# Patient Record
Sex: Male | Born: 1960 | Race: White | Hispanic: No | Marital: Single | Smoking: Current every day smoker
Health system: Southern US, Community
[De-identification: ages and names within clinical notes are randomized; demographics above are authoritative.]

## PROBLEM LIST (undated history)

## (undated) DIAGNOSIS — E785 Hyperlipidemia, unspecified: Secondary | ICD-10-CM

## (undated) DIAGNOSIS — I10 Essential (primary) hypertension: Secondary | ICD-10-CM

## (undated) DIAGNOSIS — N189 Chronic kidney disease, unspecified: Secondary | ICD-10-CM

## (undated) DIAGNOSIS — F32A Depression, unspecified: Secondary | ICD-10-CM

## (undated) DIAGNOSIS — F329 Major depressive disorder, single episode, unspecified: Secondary | ICD-10-CM

## (undated) DIAGNOSIS — I639 Cerebral infarction, unspecified: Secondary | ICD-10-CM

## (undated) HISTORY — PX: HERNIA REPAIR: SHX51

## (undated) HISTORY — DX: Essential (primary) hypertension: I10

## (undated) HISTORY — PX: REPAIR KNEE LIGAMENT: SUR1188

## (undated) HISTORY — PX: WRIST SURGERY: SHX841

## (undated) HISTORY — DX: Major depressive disorder, single episode, unspecified: F32.9

## (undated) HISTORY — DX: Hyperlipidemia, unspecified: E78.5

## (undated) HISTORY — DX: Chronic kidney disease, unspecified: N18.9

## (undated) HISTORY — DX: Cerebral infarction, unspecified: I63.9

## (undated) HISTORY — DX: Depression, unspecified: F32.A

---

## 2005-08-07 ENCOUNTER — Ambulatory Visit: Payer: Self-pay | Admitting: General Surgery

## 2006-07-09 ENCOUNTER — Inpatient Hospital Stay: Payer: Self-pay | Admitting: Unknown Physician Specialty

## 2006-07-09 ENCOUNTER — Other Ambulatory Visit: Payer: Self-pay

## 2009-02-04 ENCOUNTER — Ambulatory Visit: Payer: Self-pay | Admitting: General Surgery

## 2009-02-09 ENCOUNTER — Ambulatory Visit: Payer: Self-pay | Admitting: General Surgery

## 2009-08-06 ENCOUNTER — Inpatient Hospital Stay (HOSPITAL_COMMUNITY)
Admission: RE | Admit: 2009-08-06 | Discharge: 2009-08-20 | Payer: Self-pay | Admitting: Physical Medicine & Rehabilitation

## 2009-08-06 ENCOUNTER — Ambulatory Visit: Payer: Self-pay | Admitting: Physical Medicine & Rehabilitation

## 2009-08-07 ENCOUNTER — Ambulatory Visit: Payer: Self-pay | Admitting: Physical Medicine & Rehabilitation

## 2009-08-24 ENCOUNTER — Encounter
Admission: RE | Admit: 2009-08-24 | Discharge: 2009-11-22 | Payer: Self-pay | Admitting: Physical Medicine & Rehabilitation

## 2009-09-27 ENCOUNTER — Encounter
Admission: RE | Admit: 2009-09-27 | Discharge: 2009-11-24 | Payer: Self-pay | Admitting: Physical Medicine & Rehabilitation

## 2009-09-28 ENCOUNTER — Ambulatory Visit: Payer: Self-pay | Admitting: Physical Medicine & Rehabilitation

## 2009-11-09 ENCOUNTER — Ambulatory Visit: Payer: Self-pay | Admitting: Physical Medicine & Rehabilitation

## 2009-11-24 ENCOUNTER — Encounter
Admission: RE | Admit: 2009-11-24 | Discharge: 2009-11-25 | Payer: Self-pay | Admitting: Physical Medicine & Rehabilitation

## 2009-11-25 ENCOUNTER — Encounter
Admission: RE | Admit: 2009-11-25 | Discharge: 2009-12-01 | Payer: Self-pay | Admitting: Physical Medicine & Rehabilitation

## 2009-12-17 ENCOUNTER — Encounter
Admission: RE | Admit: 2009-12-17 | Discharge: 2010-03-17 | Payer: Self-pay | Admitting: Physical Medicine & Rehabilitation

## 2009-12-30 ENCOUNTER — Ambulatory Visit: Payer: Self-pay | Admitting: Physical Medicine & Rehabilitation

## 2010-02-18 ENCOUNTER — Ambulatory Visit: Payer: Self-pay | Admitting: Physical Medicine & Rehabilitation

## 2011-03-03 LAB — DIFFERENTIAL
Eosinophils Absolute: 0.1 10*3/uL (ref 0.0–0.7)
Eosinophils Relative: 1 % (ref 0–5)
Lymphs Abs: 1.1 10*3/uL (ref 0.7–4.0)
Monocytes Absolute: 0.6 10*3/uL (ref 0.1–1.0)
Monocytes Relative: 8 % (ref 3–12)

## 2011-03-03 LAB — COMPREHENSIVE METABOLIC PANEL
ALT: 72 U/L — ABNORMAL HIGH (ref 0–53)
AST: 47 U/L — ABNORMAL HIGH (ref 0–37)
Albumin: 4.4 g/dL (ref 3.5–5.2)
Alkaline Phosphatase: 63 U/L (ref 39–117)
Calcium: 10.2 mg/dL (ref 8.4–10.5)
GFR calc Af Amer: >60 mL/min (ref >60)
Potassium: 3.5 mEq/L (ref 3.5–5.1)
Sodium: 140 mEq/L (ref 135–145)
Total Protein: 7.7 g/dL (ref 6.0–8.3)

## 2011-03-03 LAB — CBC
MCHC: 34.2 g/dL (ref 30.0–36.0)
RDW: 13.7 % (ref 11.5–15.5)

## 2011-05-09 ENCOUNTER — Emergency Department: Payer: Self-pay | Admitting: Unknown Physician Specialty

## 2011-05-18 ENCOUNTER — Emergency Department: Payer: Self-pay | Admitting: Unknown Physician Specialty

## 2013-12-23 DIAGNOSIS — N189 Chronic kidney disease, unspecified: Secondary | ICD-10-CM | POA: Diagnosis not present

## 2013-12-23 DIAGNOSIS — Z1331 Encounter for screening for depression: Secondary | ICD-10-CM | POA: Diagnosis not present

## 2013-12-23 DIAGNOSIS — Z0000 Encounter for general adult medical examination without abnormal findings: Secondary | ICD-10-CM | POA: Diagnosis not present

## 2013-12-23 DIAGNOSIS — E785 Hyperlipidemia, unspecified: Secondary | ICD-10-CM | POA: Diagnosis not present

## 2013-12-23 DIAGNOSIS — I129 Hypertensive chronic kidney disease with stage 1 through stage 4 chronic kidney disease, or unspecified chronic kidney disease: Secondary | ICD-10-CM | POA: Diagnosis not present

## 2013-12-23 LAB — PSA

## 2014-02-04 DIAGNOSIS — I10 Essential (primary) hypertension: Secondary | ICD-10-CM | POA: Diagnosis not present

## 2014-02-04 DIAGNOSIS — I129 Hypertensive chronic kidney disease with stage 1 through stage 4 chronic kidney disease, or unspecified chronic kidney disease: Secondary | ICD-10-CM | POA: Diagnosis not present

## 2014-05-27 ENCOUNTER — Ambulatory Visit: Payer: Self-pay | Admitting: Internal Medicine

## 2014-05-28 ENCOUNTER — Emergency Department: Payer: Self-pay

## 2014-05-28 DIAGNOSIS — R109 Unspecified abdominal pain: Secondary | ICD-10-CM | POA: Diagnosis not present

## 2014-05-28 LAB — COMPREHENSIVE METABOLIC PANEL
ALBUMIN: 4.5 g/dL (ref 3.4–5.0)
ALK PHOS: 69 U/L
ALT: 34 U/L (ref 12–78)
ANION GAP: 5 — AB (ref 7–16)
BUN: 25 mg/dL — ABNORMAL HIGH (ref 7–18)
Bilirubin,Total: 1.7 mg/dL — ABNORMAL HIGH (ref 0.2–1.0)
CREATININE: 1.87 mg/dL — AB (ref 0.60–1.30)
Calcium, Total: 10.4 mg/dL — ABNORMAL HIGH (ref 8.5–10.1)
Chloride: 105 mmol/L (ref 98–107)
Co2: 28 mmol/L (ref 21–32)
EGFR (African American): 46 — ABNORMAL LOW
EGFR (Non-African Amer.): 40 — ABNORMAL LOW
GLUCOSE: 113 mg/dL — AB (ref 65–99)
Osmolality: 281 (ref 275–301)
POTASSIUM: 4.6 mmol/L (ref 3.5–5.1)
SGOT(AST): 22 U/L (ref 15–37)
Sodium: 138 mmol/L (ref 136–145)
TOTAL PROTEIN: 8.4 g/dL — AB (ref 6.4–8.2)

## 2014-05-28 LAB — CBC
HCT: 48.1 % (ref 40.0–52.0)
HGB: 15.8 g/dL (ref 13.0–18.0)
MCH: 28.4 pg (ref 26.0–34.0)
MCHC: 32.8 g/dL (ref 32.0–36.0)
MCV: 87 fL (ref 80–100)
Platelet: 93 10*3/uL — ABNORMAL LOW (ref 150–440)
RBC: 5.55 10*6/uL (ref 4.40–5.90)
RDW: 16.9 % — AB (ref 11.5–14.5)
WBC: 11.7 10*3/uL — AB (ref 3.8–10.6)

## 2014-06-09 ENCOUNTER — Inpatient Hospital Stay: Payer: Self-pay | Admitting: Internal Medicine

## 2014-06-09 DIAGNOSIS — I82220 Acute embolism and thrombosis of inferior vena cava: Secondary | ICD-10-CM | POA: Diagnosis not present

## 2014-06-09 DIAGNOSIS — I82409 Acute embolism and thrombosis of unspecified deep veins of unspecified lower extremity: Secondary | ICD-10-CM | POA: Diagnosis not present

## 2014-06-09 DIAGNOSIS — R1031 Right lower quadrant pain: Secondary | ICD-10-CM | POA: Diagnosis not present

## 2014-06-09 DIAGNOSIS — D72829 Elevated white blood cell count, unspecified: Secondary | ICD-10-CM | POA: Diagnosis not present

## 2014-06-09 DIAGNOSIS — I823 Embolism and thrombosis of renal vein: Secondary | ICD-10-CM | POA: Diagnosis not present

## 2014-06-09 DIAGNOSIS — F17200 Nicotine dependence, unspecified, uncomplicated: Secondary | ICD-10-CM | POA: Diagnosis not present

## 2014-06-09 DIAGNOSIS — K8020 Calculus of gallbladder without cholecystitis without obstruction: Secondary | ICD-10-CM | POA: Diagnosis not present

## 2014-06-09 DIAGNOSIS — I10 Essential (primary) hypertension: Secondary | ICD-10-CM | POA: Diagnosis not present

## 2014-06-09 DIAGNOSIS — N390 Urinary tract infection, site not specified: Secondary | ICD-10-CM | POA: Diagnosis not present

## 2014-06-09 DIAGNOSIS — N179 Acute kidney failure, unspecified: Secondary | ICD-10-CM | POA: Diagnosis not present

## 2014-06-09 LAB — COMPREHENSIVE METABOLIC PANEL
ALBUMIN: 3.8 g/dL (ref 3.4–5.0)
ALT: 21 U/L (ref 12–78)
ANION GAP: 6 — AB (ref 7–16)
Alkaline Phosphatase: 74 U/L
BUN: 33 mg/dL — ABNORMAL HIGH (ref 7–18)
Bilirubin,Total: 1.7 mg/dL — ABNORMAL HIGH (ref 0.2–1.0)
CALCIUM: 11 mg/dL — AB (ref 8.5–10.1)
Chloride: 102 mmol/L (ref 98–107)
Co2: 27 mmol/L (ref 21–32)
Creatinine: 1.95 mg/dL — ABNORMAL HIGH (ref 0.60–1.30)
EGFR (Non-African Amer.): 38 — ABNORMAL LOW
GFR CALC AF AMER: 44 — AB
GLUCOSE: 120 mg/dL — AB (ref 65–99)
Osmolality: 279 (ref 275–301)
POTASSIUM: 4.3 mmol/L (ref 3.5–5.1)
SGOT(AST): 23 U/L (ref 15–37)
SODIUM: 135 mmol/L — AB (ref 136–145)
TOTAL PROTEIN: 8.9 g/dL — AB (ref 6.4–8.2)

## 2014-06-09 LAB — URINALYSIS, COMPLETE
BACTERIA: NONE SEEN
BLOOD: NEGATIVE
Bilirubin,UR: NEGATIVE
Glucose,UR: NEGATIVE mg/dL (ref 0–75)
Ketone: NEGATIVE
Leukocyte Esterase: NEGATIVE
Nitrite: NEGATIVE
PH: 5 (ref 4.5–8.0)
SQUAMOUS EPITHELIAL: NONE SEEN
Specific Gravity: 1.021 (ref 1.003–1.030)
WBC UR: 1 /HPF (ref 0–5)

## 2014-06-09 LAB — CBC
HCT: 44 % (ref 40.0–52.0)
HGB: 14.9 g/dL (ref 13.0–18.0)
MCH: 29.2 pg (ref 26.0–34.0)
MCHC: 33.9 g/dL (ref 32.0–36.0)
MCV: 86 fL (ref 80–100)
Platelet: 170 10*3/uL (ref 150–440)
RBC: 5.1 10*6/uL (ref 4.40–5.90)
RDW: 15.8 % — ABNORMAL HIGH (ref 11.5–14.5)
WBC: 18.3 10*3/uL — ABNORMAL HIGH (ref 3.8–10.6)

## 2014-06-09 LAB — TROPONIN I: Troponin-I: <0.02 ng/mL

## 2014-06-09 LAB — TSH: Thyroid Stimulating Horm: 2.04 u[IU]/mL

## 2014-06-09 LAB — PROTIME-INR
INR: 1.1
PROTHROMBIN TIME: 13.7 s (ref 11.5–14.7)

## 2014-06-09 LAB — APTT: ACTIVATED PTT: 39.7 s — AB (ref 23.6–35.9)

## 2014-06-09 LAB — LIPASE, BLOOD: Lipase: 205 U/L (ref 73–393)

## 2014-06-10 DIAGNOSIS — I824Y9 Acute embolism and thrombosis of unspecified deep veins of unspecified proximal lower extremity: Secondary | ICD-10-CM | POA: Diagnosis not present

## 2014-06-10 DIAGNOSIS — I10 Essential (primary) hypertension: Secondary | ICD-10-CM | POA: Diagnosis not present

## 2014-06-10 DIAGNOSIS — D72829 Elevated white blood cell count, unspecified: Secondary | ICD-10-CM | POA: Diagnosis not present

## 2014-06-10 DIAGNOSIS — J9819 Other pulmonary collapse: Secondary | ICD-10-CM | POA: Diagnosis not present

## 2014-06-10 LAB — CBC WITH DIFFERENTIAL/PLATELET
BASOS ABS: 0.1 10*3/uL (ref 0.0–0.1)
BASOS PCT: 0.6 %
BASOS PCT: 0.8 %
Basophil #: 0.1 10*3/uL (ref 0.0–0.1)
EOS ABS: 0.2 10*3/uL (ref 0.0–0.7)
EOS ABS: 0.2 10*3/uL (ref 0.0–0.7)
EOS PCT: 1.6 %
Eosinophil %: 1.2 %
HCT: 38.3 % — ABNORMAL LOW (ref 40.0–52.0)
HCT: 39.8 % — ABNORMAL LOW (ref 40.0–52.0)
HGB: 12.9 g/dL — ABNORMAL LOW (ref 13.0–18.0)
HGB: 13.4 g/dL (ref 13.0–18.0)
LYMPHS ABS: 1.7 10*3/uL (ref 1.0–3.6)
LYMPHS PCT: 12.8 %
Lymphocyte #: 1.5 10*3/uL (ref 1.0–3.6)
Lymphocyte %: 12.8 %
MCH: 28.8 pg (ref 26.0–34.0)
MCH: 29.1 pg (ref 26.0–34.0)
MCHC: 33.6 g/dL (ref 32.0–36.0)
MCHC: 33.6 g/dL (ref 32.0–36.0)
MCV: 86 fL (ref 80–100)
MCV: 87 fL (ref 80–100)
MONOS PCT: 8.2 %
Monocyte #: 1.1 x10 3/mm — ABNORMAL HIGH (ref 0.2–1.0)
Monocyte #: 1.1 x10 3/mm — ABNORMAL HIGH (ref 0.2–1.0)
Monocyte %: 8.9 %
NEUTROS ABS: 9.1 10*3/uL — AB (ref 1.4–6.5)
NEUTROS PCT: 75.9 %
Neutrophil #: 10.3 10*3/uL — ABNORMAL HIGH (ref 1.4–6.5)
Neutrophil %: 77.2 %
PLATELETS: 127 10*3/uL — AB (ref 150–440)
PLATELETS: 162 10*3/uL (ref 150–440)
RBC: 4.46 10*6/uL (ref 4.40–5.90)
RBC: 4.59 10*6/uL (ref 4.40–5.90)
RDW: 16.2 % — AB (ref 11.5–14.5)
RDW: 16.3 % — ABNORMAL HIGH (ref 11.5–14.5)
WBC: 13.4 10*3/uL — ABNORMAL HIGH (ref 3.8–10.6)
WBC: 12 10*3/uL — ABNORMAL HIGH (ref 3.8–10.6)

## 2014-06-10 LAB — BASIC METABOLIC PANEL
Anion Gap: 8 (ref 7–16)
BUN: 35 mg/dL — AB (ref 7–18)
CHLORIDE: 103 mmol/L (ref 98–107)
CREATININE: 1.8 mg/dL — AB (ref 0.60–1.30)
Calcium, Total: 9 mg/dL (ref 8.5–10.1)
Co2: 24 mmol/L (ref 21–32)
EGFR (African American): 48 — ABNORMAL LOW
EGFR (Non-African Amer.): 42 — ABNORMAL LOW
Glucose: 111 mg/dL — ABNORMAL HIGH (ref 65–99)
Osmolality: 279 (ref 275–301)
POTASSIUM: 3.9 mmol/L (ref 3.5–5.1)
SODIUM: 135 mmol/L — AB (ref 136–145)

## 2014-06-10 LAB — PROTEIN / CREATININE RATIO, URINE
CREATININE, URINE: 158.7 mg/dL — AB (ref 30.0–125.0)
PROTEIN, RANDOM URINE: 32 mg/dL — AB (ref 0–12)
PROTEIN/CREAT. RATIO: 202 mg/g{creat} — AB (ref 0–200)

## 2014-06-10 LAB — PROTIME-INR
INR: 1.1
Prothrombin Time: 13.6 secs (ref 11.5–14.7)

## 2014-06-10 LAB — HEPARIN LEVEL (UNFRACTIONATED)
Anti-Xa(Unfractionated): 0.19 IU/mL — ABNORMAL LOW (ref 0.30–0.70)
Anti-Xa(Unfractionated): 0.48 IU/mL (ref 0.30–0.70)

## 2014-06-11 DIAGNOSIS — N179 Acute kidney failure, unspecified: Secondary | ICD-10-CM | POA: Diagnosis not present

## 2014-06-11 DIAGNOSIS — I824Y9 Acute embolism and thrombosis of unspecified deep veins of unspecified proximal lower extremity: Secondary | ICD-10-CM | POA: Diagnosis not present

## 2014-06-11 DIAGNOSIS — I82890 Acute embolism and thrombosis of other specified veins: Secondary | ICD-10-CM | POA: Diagnosis not present

## 2014-06-11 DIAGNOSIS — R799 Abnormal finding of blood chemistry, unspecified: Secondary | ICD-10-CM | POA: Diagnosis not present

## 2014-06-11 DIAGNOSIS — I10 Essential (primary) hypertension: Secondary | ICD-10-CM | POA: Diagnosis not present

## 2014-06-11 DIAGNOSIS — D696 Thrombocytopenia, unspecified: Secondary | ICD-10-CM | POA: Diagnosis not present

## 2014-06-11 DIAGNOSIS — D72829 Elevated white blood cell count, unspecified: Secondary | ICD-10-CM | POA: Diagnosis not present

## 2014-06-11 LAB — CBC WITH DIFFERENTIAL/PLATELET
Basophil #: 0.1 10*3/uL (ref 0.0–0.1)
Basophil %: 0.7 %
EOS ABS: 0.2 10*3/uL (ref 0.0–0.7)
EOS PCT: 2.1 %
HCT: 36.1 % — ABNORMAL LOW (ref 40.0–52.0)
HGB: 12.2 g/dL — AB (ref 13.0–18.0)
LYMPHS PCT: 11.4 %
Lymphocyte #: 1.1 10*3/uL (ref 1.0–3.6)
MCH: 28.8 pg (ref 26.0–34.0)
MCHC: 33.7 g/dL (ref 32.0–36.0)
MCV: 86 fL (ref 80–100)
Monocyte #: 0.7 x10 3/mm (ref 0.2–1.0)
Monocyte %: 7.5 %
NEUTROS ABS: 7.7 10*3/uL — AB (ref 1.4–6.5)
Neutrophil %: 78.3 %
Platelet: 171 10*3/uL (ref 150–440)
RBC: 4.22 10*6/uL — AB (ref 4.40–5.90)
RDW: 16.1 % — ABNORMAL HIGH (ref 11.5–14.5)
WBC: 9.8 10*3/uL (ref 3.8–10.6)

## 2014-06-11 LAB — BASIC METABOLIC PANEL
ANION GAP: 7 (ref 7–16)
BUN: 25 mg/dL — AB (ref 7–18)
CO2: 23 mmol/L (ref 21–32)
Calcium, Total: 8.4 mg/dL — ABNORMAL LOW (ref 8.5–10.1)
Chloride: 109 mmol/L — ABNORMAL HIGH (ref 98–107)
Creatinine: 1.51 mg/dL — ABNORMAL HIGH (ref 0.60–1.30)
EGFR (African American): 60 — ABNORMAL LOW
EGFR (Non-African Amer.): 52 — ABNORMAL LOW
GLUCOSE: 106 mg/dL — AB (ref 65–99)
Osmolality: 282 (ref 275–301)
POTASSIUM: 4 mmol/L (ref 3.5–5.1)
Sodium: 139 mmol/L (ref 136–145)

## 2014-06-11 LAB — HEPARIN LEVEL (UNFRACTIONATED)
ANTI-XA(UNFRACTIONATED): 0.39 [IU]/mL (ref 0.30–0.70)
Anti-Xa(Unfractionated): 0.22 IU/mL — ABNORMAL LOW (ref 0.30–0.70)

## 2014-06-11 LAB — PROT IMMUNOELECTROPHORES(ARMC)

## 2014-06-12 DIAGNOSIS — I10 Essential (primary) hypertension: Secondary | ICD-10-CM | POA: Diagnosis not present

## 2014-06-12 DIAGNOSIS — D72829 Elevated white blood cell count, unspecified: Secondary | ICD-10-CM | POA: Diagnosis not present

## 2014-06-12 DIAGNOSIS — I824Y9 Acute embolism and thrombosis of unspecified deep veins of unspecified proximal lower extremity: Secondary | ICD-10-CM | POA: Diagnosis not present

## 2014-06-12 LAB — CBC WITH DIFFERENTIAL/PLATELET
Basophil #: 0.1 10*3/uL (ref 0.0–0.1)
Basophil %: 1 %
Eosinophil #: 0.1 10*3/uL (ref 0.0–0.7)
Eosinophil %: 1.7 %
HCT: 34.6 % — ABNORMAL LOW (ref 40.0–52.0)
HGB: 11.7 g/dL — AB (ref 13.0–18.0)
LYMPHS ABS: 1.3 10*3/uL (ref 1.0–3.6)
LYMPHS PCT: 15.5 %
MCH: 29 pg (ref 26.0–34.0)
MCHC: 33.8 g/dL (ref 32.0–36.0)
MCV: 86 fL (ref 80–100)
MONO ABS: 0.6 x10 3/mm (ref 0.2–1.0)
MONOS PCT: 7.2 %
NEUTROS ABS: 6.4 10*3/uL (ref 1.4–6.5)
Neutrophil %: 74.6 %
PLATELETS: 215 10*3/uL (ref 150–440)
RBC: 4.03 10*6/uL — AB (ref 4.40–5.90)
RDW: 15.5 % — ABNORMAL HIGH (ref 11.5–14.5)
WBC: 8.6 10*3/uL (ref 3.8–10.6)

## 2014-06-12 LAB — BASIC METABOLIC PANEL
Anion Gap: 7 (ref 7–16)
BUN: 17 mg/dL (ref 7–18)
CO2: 23 mmol/L (ref 21–32)
Calcium, Total: 8.6 mg/dL (ref 8.5–10.1)
Chloride: 110 mmol/L — ABNORMAL HIGH (ref 98–107)
Creatinine: 1.34 mg/dL — ABNORMAL HIGH (ref 0.60–1.30)
EGFR (Non-African Amer.): 60 — ABNORMAL LOW
Glucose: 100 mg/dL — ABNORMAL HIGH (ref 65–99)
Osmolality: 281 (ref 275–301)
POTASSIUM: 4 mmol/L (ref 3.5–5.1)
Sodium: 140 mmol/L (ref 136–145)

## 2014-06-12 LAB — UR PROT ELECTROPHORESIS, URINE RANDOM

## 2014-06-12 LAB — HEPARIN LEVEL (UNFRACTIONATED): Anti-Xa(Unfractionated): 0.32 IU/mL (ref 0.30–0.70)

## 2014-06-13 DIAGNOSIS — R809 Proteinuria, unspecified: Secondary | ICD-10-CM | POA: Diagnosis not present

## 2014-06-13 DIAGNOSIS — I10 Essential (primary) hypertension: Secondary | ICD-10-CM | POA: Diagnosis not present

## 2014-06-13 DIAGNOSIS — I824Y9 Acute embolism and thrombosis of unspecified deep veins of unspecified proximal lower extremity: Secondary | ICD-10-CM | POA: Diagnosis not present

## 2014-06-13 DIAGNOSIS — N179 Acute kidney failure, unspecified: Secondary | ICD-10-CM | POA: Diagnosis not present

## 2014-06-13 DIAGNOSIS — D72829 Elevated white blood cell count, unspecified: Secondary | ICD-10-CM | POA: Diagnosis not present

## 2014-06-13 LAB — CBC WITH DIFFERENTIAL/PLATELET
Basophil #: 0.1 10*3/uL (ref 0.0–0.1)
Basophil %: 1.3 %
EOS PCT: 2 %
Eosinophil #: 0.2 10*3/uL (ref 0.0–0.7)
HCT: 34.5 % — ABNORMAL LOW (ref 40.0–52.0)
HGB: 11.6 g/dL — ABNORMAL LOW (ref 13.0–18.0)
Lymphocyte #: 1.4 10*3/uL (ref 1.0–3.6)
Lymphocyte %: 17.1 %
MCH: 29 pg (ref 26.0–34.0)
MCHC: 33.6 g/dL (ref 32.0–36.0)
MCV: 86 fL (ref 80–100)
MONO ABS: 0.7 x10 3/mm (ref 0.2–1.0)
MONOS PCT: 8.5 %
Neutrophil #: 5.7 10*3/uL (ref 1.4–6.5)
Neutrophil %: 71.1 %
Platelet: 245 10*3/uL (ref 150–440)
RBC: 4 10*6/uL — ABNORMAL LOW (ref 4.40–5.90)
RDW: 15.7 % — AB (ref 11.5–14.5)
WBC: 8 10*3/uL (ref 3.8–10.6)

## 2014-06-13 LAB — HEPARIN LEVEL (UNFRACTIONATED): Anti-Xa(Unfractionated): 0.66 IU/mL (ref 0.30–0.70)

## 2014-06-14 DIAGNOSIS — I824Y9 Acute embolism and thrombosis of unspecified deep veins of unspecified proximal lower extremity: Secondary | ICD-10-CM | POA: Diagnosis not present

## 2014-06-14 DIAGNOSIS — N189 Chronic kidney disease, unspecified: Secondary | ICD-10-CM | POA: Diagnosis not present

## 2014-06-14 DIAGNOSIS — D72829 Elevated white blood cell count, unspecified: Secondary | ICD-10-CM | POA: Diagnosis not present

## 2014-06-14 DIAGNOSIS — N179 Acute kidney failure, unspecified: Secondary | ICD-10-CM | POA: Diagnosis not present

## 2014-06-14 DIAGNOSIS — I10 Essential (primary) hypertension: Secondary | ICD-10-CM | POA: Diagnosis not present

## 2014-06-14 LAB — CBC WITH DIFFERENTIAL/PLATELET
BASOS ABS: 0.1 10*3/uL (ref 0.0–0.1)
Basophil %: 1 %
Eosinophil #: 0.2 10*3/uL (ref 0.0–0.7)
Eosinophil %: 2.4 %
HCT: 32.3 % — ABNORMAL LOW (ref 40.0–52.0)
HGB: 11.1 g/dL — AB (ref 13.0–18.0)
LYMPHS PCT: 19.1 %
Lymphocyte #: 1.4 10*3/uL (ref 1.0–3.6)
MCH: 29.5 pg (ref 26.0–34.0)
MCHC: 34.5 g/dL (ref 32.0–36.0)
MCV: 86 fL (ref 80–100)
MONOS PCT: 7.3 %
Monocyte #: 0.5 x10 3/mm (ref 0.2–1.0)
NEUTROS ABS: 5 10*3/uL (ref 1.4–6.5)
Neutrophil %: 70.2 %
Platelet: 245 10*3/uL (ref 150–440)
RBC: 3.77 10*6/uL — AB (ref 4.40–5.90)
RDW: 15.8 % — ABNORMAL HIGH (ref 11.5–14.5)
WBC: 7.2 10*3/uL (ref 3.8–10.6)

## 2014-06-14 LAB — HEPARIN LEVEL (UNFRACTIONATED): ANTI-XA(UNFRACTIONATED): 0.52 [IU]/mL (ref 0.30–0.70)

## 2014-06-14 LAB — RENAL FUNCTION PANEL
ALBUMIN: 2.6 g/dL — AB (ref 3.4–5.0)
Anion Gap: 7 (ref 7–16)
BUN: 15 mg/dL (ref 7–18)
CALCIUM: 8.6 mg/dL (ref 8.5–10.1)
CO2: 23 mmol/L (ref 21–32)
CREATININE: 1.42 mg/dL — AB (ref 0.60–1.30)
Chloride: 111 mmol/L — ABNORMAL HIGH (ref 98–107)
GFR CALC NON AF AMER: 56 — AB
GLUCOSE: 99 mg/dL (ref 65–99)
Osmolality: 282 (ref 275–301)
PHOSPHORUS: 2.6 mg/dL (ref 2.5–4.9)
POTASSIUM: 3.9 mmol/L (ref 3.5–5.1)
Sodium: 141 mmol/L (ref 136–145)

## 2014-06-15 DIAGNOSIS — N179 Acute kidney failure, unspecified: Secondary | ICD-10-CM | POA: Diagnosis not present

## 2014-06-15 DIAGNOSIS — I10 Essential (primary) hypertension: Secondary | ICD-10-CM | POA: Diagnosis not present

## 2014-06-15 DIAGNOSIS — I824Y9 Acute embolism and thrombosis of unspecified deep veins of unspecified proximal lower extremity: Secondary | ICD-10-CM | POA: Diagnosis not present

## 2014-06-15 DIAGNOSIS — D72829 Elevated white blood cell count, unspecified: Secondary | ICD-10-CM | POA: Diagnosis not present

## 2014-06-15 LAB — RENAL FUNCTION PANEL
ALBUMIN: 2.9 g/dL — AB (ref 3.4–5.0)
ANION GAP: 8 (ref 7–16)
BUN: 13 mg/dL (ref 7–18)
CO2: 23 mmol/L (ref 21–32)
CREATININE: 1.43 mg/dL — AB (ref 0.60–1.30)
Calcium, Total: 8.8 mg/dL (ref 8.5–10.1)
Chloride: 109 mmol/L — ABNORMAL HIGH (ref 98–107)
EGFR (African American): >60
EGFR (Non-African Amer.): 55 — ABNORMAL LOW
Glucose: 100 mg/dL — ABNORMAL HIGH (ref 65–99)
OSMOLALITY: 280 (ref 275–301)
Phosphorus: 2.4 mg/dL — ABNORMAL LOW (ref 2.5–4.9)
Potassium: 3.7 mmol/L (ref 3.5–5.1)
Sodium: 140 mmol/L (ref 136–145)

## 2014-06-15 LAB — KAPPA/LAMBDA FREE LIGHT CHAINS (ARMC)

## 2014-06-15 LAB — CBC WITH DIFFERENTIAL/PLATELET
BASOS ABS: 0.1 10*3/uL (ref 0.0–0.1)
Basophil %: 0.6 %
EOS ABS: 0.1 10*3/uL (ref 0.0–0.7)
EOS PCT: 1.7 %
HCT: 35 % — ABNORMAL LOW (ref 40.0–52.0)
HGB: 12 g/dL — AB (ref 13.0–18.0)
LYMPHS ABS: 0.8 10*3/uL — AB (ref 1.0–3.6)
Lymphocyte %: 9.7 %
MCH: 29.3 pg (ref 26.0–34.0)
MCHC: 34.3 g/dL (ref 32.0–36.0)
MCV: 85 fL (ref 80–100)
MONO ABS: 0.6 x10 3/mm (ref 0.2–1.0)
MONOS PCT: 6.7 %
Neutrophil #: 6.9 10*3/uL — ABNORMAL HIGH (ref 1.4–6.5)
Neutrophil %: 81.3 %
Platelet: 240 10*3/uL (ref 150–440)
RBC: 4.1 10*6/uL — ABNORMAL LOW (ref 4.40–5.90)
RDW: 15.7 % — AB (ref 11.5–14.5)
WBC: 8.5 10*3/uL (ref 3.8–10.6)

## 2014-06-15 LAB — PSA: PSA: 0.9 ng/mL (ref 0.0–4.0)

## 2014-06-15 LAB — CEA: CEA: 1.5 ng/mL (ref 0.0–4.7)

## 2014-06-15 LAB — CANCER ANTIGEN 19-9: CA 19 9: 25 U/mL (ref 0–35)

## 2014-06-16 DIAGNOSIS — I824Y9 Acute embolism and thrombosis of unspecified deep veins of unspecified proximal lower extremity: Secondary | ICD-10-CM | POA: Diagnosis not present

## 2014-06-16 DIAGNOSIS — Z7901 Long term (current) use of anticoagulants: Secondary | ICD-10-CM | POA: Diagnosis not present

## 2014-06-16 DIAGNOSIS — Z9181 History of falling: Secondary | ICD-10-CM | POA: Diagnosis not present

## 2014-06-16 DIAGNOSIS — I10 Essential (primary) hypertension: Secondary | ICD-10-CM | POA: Diagnosis not present

## 2014-06-16 DIAGNOSIS — M6281 Muscle weakness (generalized): Secondary | ICD-10-CM | POA: Diagnosis not present

## 2014-06-18 DIAGNOSIS — M6281 Muscle weakness (generalized): Secondary | ICD-10-CM | POA: Diagnosis not present

## 2014-06-18 DIAGNOSIS — Z9181 History of falling: Secondary | ICD-10-CM | POA: Diagnosis not present

## 2014-06-18 DIAGNOSIS — I824Y9 Acute embolism and thrombosis of unspecified deep veins of unspecified proximal lower extremity: Secondary | ICD-10-CM | POA: Diagnosis not present

## 2014-06-18 DIAGNOSIS — Z7901 Long term (current) use of anticoagulants: Secondary | ICD-10-CM | POA: Diagnosis not present

## 2014-06-18 DIAGNOSIS — I10 Essential (primary) hypertension: Secondary | ICD-10-CM | POA: Diagnosis not present

## 2014-06-20 DIAGNOSIS — I824Y9 Acute embolism and thrombosis of unspecified deep veins of unspecified proximal lower extremity: Secondary | ICD-10-CM | POA: Diagnosis not present

## 2014-06-20 DIAGNOSIS — I10 Essential (primary) hypertension: Secondary | ICD-10-CM | POA: Diagnosis not present

## 2014-06-20 DIAGNOSIS — Z7901 Long term (current) use of anticoagulants: Secondary | ICD-10-CM | POA: Diagnosis not present

## 2014-06-20 DIAGNOSIS — Z9181 History of falling: Secondary | ICD-10-CM | POA: Diagnosis not present

## 2014-06-20 DIAGNOSIS — M6281 Muscle weakness (generalized): Secondary | ICD-10-CM | POA: Diagnosis not present

## 2014-06-22 DIAGNOSIS — M6281 Muscle weakness (generalized): Secondary | ICD-10-CM | POA: Diagnosis not present

## 2014-06-22 DIAGNOSIS — I824Y9 Acute embolism and thrombosis of unspecified deep veins of unspecified proximal lower extremity: Secondary | ICD-10-CM | POA: Diagnosis not present

## 2014-06-22 DIAGNOSIS — Z7901 Long term (current) use of anticoagulants: Secondary | ICD-10-CM | POA: Diagnosis not present

## 2014-06-22 DIAGNOSIS — I10 Essential (primary) hypertension: Secondary | ICD-10-CM | POA: Diagnosis not present

## 2014-06-22 DIAGNOSIS — Z9181 History of falling: Secondary | ICD-10-CM | POA: Diagnosis not present

## 2014-06-24 DIAGNOSIS — M6281 Muscle weakness (generalized): Secondary | ICD-10-CM | POA: Diagnosis not present

## 2014-06-24 DIAGNOSIS — I824Y9 Acute embolism and thrombosis of unspecified deep veins of unspecified proximal lower extremity: Secondary | ICD-10-CM | POA: Diagnosis not present

## 2014-06-24 DIAGNOSIS — Z7901 Long term (current) use of anticoagulants: Secondary | ICD-10-CM | POA: Diagnosis not present

## 2014-06-24 DIAGNOSIS — I10 Essential (primary) hypertension: Secondary | ICD-10-CM | POA: Diagnosis not present

## 2014-06-24 DIAGNOSIS — Z9181 History of falling: Secondary | ICD-10-CM | POA: Diagnosis not present

## 2014-06-26 DIAGNOSIS — I129 Hypertensive chronic kidney disease with stage 1 through stage 4 chronic kidney disease, or unspecified chronic kidney disease: Secondary | ICD-10-CM | POA: Diagnosis not present

## 2014-06-26 DIAGNOSIS — I82409 Acute embolism and thrombosis of unspecified deep veins of unspecified lower extremity: Secondary | ICD-10-CM | POA: Diagnosis not present

## 2014-06-26 DIAGNOSIS — Z9181 History of falling: Secondary | ICD-10-CM | POA: Diagnosis not present

## 2014-06-26 DIAGNOSIS — M6281 Muscle weakness (generalized): Secondary | ICD-10-CM | POA: Diagnosis not present

## 2014-06-26 DIAGNOSIS — Z7901 Long term (current) use of anticoagulants: Secondary | ICD-10-CM | POA: Diagnosis not present

## 2014-06-26 DIAGNOSIS — I824Y9 Acute embolism and thrombosis of unspecified deep veins of unspecified proximal lower extremity: Secondary | ICD-10-CM | POA: Diagnosis not present

## 2014-06-26 DIAGNOSIS — I10 Essential (primary) hypertension: Secondary | ICD-10-CM | POA: Diagnosis not present

## 2014-06-29 ENCOUNTER — Ambulatory Visit: Payer: Self-pay | Admitting: Internal Medicine

## 2014-06-29 LAB — CBC CANCER CENTER
Basophil #: 0.1 x10 3/mm (ref 0.0–0.1)
Basophil %: 1.2 %
EOS ABS: 0.2 x10 3/mm (ref 0.0–0.7)
EOS PCT: 1.8 %
HCT: 41.7 % (ref 40.0–52.0)
HGB: 14.3 g/dL (ref 13.0–18.0)
LYMPHS ABS: 1.4 x10 3/mm (ref 1.0–3.6)
Lymphocyte %: 12.8 %
MCH: 29.6 pg (ref 26.0–34.0)
MCHC: 34.2 g/dL (ref 32.0–36.0)
MCV: 87 fL (ref 80–100)
MONO ABS: 0.6 x10 3/mm (ref 0.2–1.0)
MONOS PCT: 5.9 %
NEUTROS PCT: 78.3 %
Neutrophil #: 8.6 x10 3/mm — ABNORMAL HIGH (ref 1.4–6.5)
PLATELETS: 173 x10 3/mm (ref 150–440)
RBC: 4.83 10*6/uL (ref 4.40–5.90)
RDW: 16.6 % — AB (ref 11.5–14.5)
WBC: 11 x10 3/mm — ABNORMAL HIGH (ref 3.8–10.6)

## 2014-06-29 LAB — CREATININE, SERUM
Creatinine: 1.7 mg/dL — ABNORMAL HIGH (ref 0.60–1.30)
EGFR (African American): 52 — ABNORMAL LOW
GFR CALC NON AF AMER: 45 — AB

## 2014-06-29 LAB — CALCIUM: Calcium, Total: 10.2 mg/dL — ABNORMAL HIGH (ref 8.5–10.1)

## 2014-06-30 DIAGNOSIS — I10 Essential (primary) hypertension: Secondary | ICD-10-CM | POA: Diagnosis not present

## 2014-06-30 DIAGNOSIS — I824Y9 Acute embolism and thrombosis of unspecified deep veins of unspecified proximal lower extremity: Secondary | ICD-10-CM | POA: Diagnosis not present

## 2014-06-30 DIAGNOSIS — Z9181 History of falling: Secondary | ICD-10-CM | POA: Diagnosis not present

## 2014-06-30 DIAGNOSIS — M6281 Muscle weakness (generalized): Secondary | ICD-10-CM | POA: Diagnosis not present

## 2014-06-30 DIAGNOSIS — Z7901 Long term (current) use of anticoagulants: Secondary | ICD-10-CM | POA: Diagnosis not present

## 2014-06-30 DIAGNOSIS — I69998 Other sequelae following unspecified cerebrovascular disease: Secondary | ICD-10-CM | POA: Diagnosis not present

## 2014-07-02 DIAGNOSIS — I10 Essential (primary) hypertension: Secondary | ICD-10-CM | POA: Diagnosis not present

## 2014-07-02 DIAGNOSIS — M6281 Muscle weakness (generalized): Secondary | ICD-10-CM | POA: Diagnosis not present

## 2014-07-02 DIAGNOSIS — I824Y9 Acute embolism and thrombosis of unspecified deep veins of unspecified proximal lower extremity: Secondary | ICD-10-CM | POA: Diagnosis not present

## 2014-07-02 DIAGNOSIS — Z7901 Long term (current) use of anticoagulants: Secondary | ICD-10-CM | POA: Diagnosis not present

## 2014-07-02 DIAGNOSIS — Z9181 History of falling: Secondary | ICD-10-CM | POA: Diagnosis not present

## 2014-07-03 DIAGNOSIS — Z9181 History of falling: Secondary | ICD-10-CM | POA: Diagnosis not present

## 2014-07-03 DIAGNOSIS — I10 Essential (primary) hypertension: Secondary | ICD-10-CM | POA: Diagnosis not present

## 2014-07-03 DIAGNOSIS — I824Y9 Acute embolism and thrombosis of unspecified deep veins of unspecified proximal lower extremity: Secondary | ICD-10-CM | POA: Diagnosis not present

## 2014-07-03 DIAGNOSIS — M6281 Muscle weakness (generalized): Secondary | ICD-10-CM | POA: Diagnosis not present

## 2014-07-03 DIAGNOSIS — Z7901 Long term (current) use of anticoagulants: Secondary | ICD-10-CM | POA: Diagnosis not present

## 2014-07-06 DIAGNOSIS — I824Y9 Acute embolism and thrombosis of unspecified deep veins of unspecified proximal lower extremity: Secondary | ICD-10-CM | POA: Diagnosis not present

## 2014-07-06 DIAGNOSIS — Z9181 History of falling: Secondary | ICD-10-CM | POA: Diagnosis not present

## 2014-07-06 DIAGNOSIS — M6281 Muscle weakness (generalized): Secondary | ICD-10-CM | POA: Diagnosis not present

## 2014-07-06 DIAGNOSIS — Z7901 Long term (current) use of anticoagulants: Secondary | ICD-10-CM | POA: Diagnosis not present

## 2014-07-06 DIAGNOSIS — I10 Essential (primary) hypertension: Secondary | ICD-10-CM | POA: Diagnosis not present

## 2014-07-08 DIAGNOSIS — Z7901 Long term (current) use of anticoagulants: Secondary | ICD-10-CM | POA: Diagnosis not present

## 2014-07-08 DIAGNOSIS — I10 Essential (primary) hypertension: Secondary | ICD-10-CM | POA: Diagnosis not present

## 2014-07-08 DIAGNOSIS — Z9181 History of falling: Secondary | ICD-10-CM | POA: Diagnosis not present

## 2014-07-08 DIAGNOSIS — I824Y9 Acute embolism and thrombosis of unspecified deep veins of unspecified proximal lower extremity: Secondary | ICD-10-CM | POA: Diagnosis not present

## 2014-07-08 DIAGNOSIS — M6281 Muscle weakness (generalized): Secondary | ICD-10-CM | POA: Diagnosis not present

## 2014-07-10 DIAGNOSIS — Z9181 History of falling: Secondary | ICD-10-CM | POA: Diagnosis not present

## 2014-07-10 DIAGNOSIS — Z7901 Long term (current) use of anticoagulants: Secondary | ICD-10-CM | POA: Diagnosis not present

## 2014-07-10 DIAGNOSIS — I824Y9 Acute embolism and thrombosis of unspecified deep veins of unspecified proximal lower extremity: Secondary | ICD-10-CM | POA: Diagnosis not present

## 2014-07-10 DIAGNOSIS — M6281 Muscle weakness (generalized): Secondary | ICD-10-CM | POA: Diagnosis not present

## 2014-07-10 DIAGNOSIS — I10 Essential (primary) hypertension: Secondary | ICD-10-CM | POA: Diagnosis not present

## 2014-07-13 DIAGNOSIS — I10 Essential (primary) hypertension: Secondary | ICD-10-CM | POA: Diagnosis not present

## 2014-07-13 DIAGNOSIS — M6281 Muscle weakness (generalized): Secondary | ICD-10-CM | POA: Diagnosis not present

## 2014-07-13 DIAGNOSIS — Z9181 History of falling: Secondary | ICD-10-CM | POA: Diagnosis not present

## 2014-07-13 DIAGNOSIS — I824Y9 Acute embolism and thrombosis of unspecified deep veins of unspecified proximal lower extremity: Secondary | ICD-10-CM | POA: Diagnosis not present

## 2014-07-13 DIAGNOSIS — Z7901 Long term (current) use of anticoagulants: Secondary | ICD-10-CM | POA: Diagnosis not present

## 2014-07-14 DIAGNOSIS — Z9181 History of falling: Secondary | ICD-10-CM | POA: Diagnosis not present

## 2014-07-14 DIAGNOSIS — I10 Essential (primary) hypertension: Secondary | ICD-10-CM | POA: Diagnosis not present

## 2014-07-14 DIAGNOSIS — M6281 Muscle weakness (generalized): Secondary | ICD-10-CM | POA: Diagnosis not present

## 2014-07-14 DIAGNOSIS — I824Y9 Acute embolism and thrombosis of unspecified deep veins of unspecified proximal lower extremity: Secondary | ICD-10-CM | POA: Diagnosis not present

## 2014-07-14 DIAGNOSIS — Z7901 Long term (current) use of anticoagulants: Secondary | ICD-10-CM | POA: Diagnosis not present

## 2014-07-17 DIAGNOSIS — Z9181 History of falling: Secondary | ICD-10-CM | POA: Diagnosis not present

## 2014-07-17 DIAGNOSIS — M6281 Muscle weakness (generalized): Secondary | ICD-10-CM | POA: Diagnosis not present

## 2014-07-17 DIAGNOSIS — I10 Essential (primary) hypertension: Secondary | ICD-10-CM | POA: Diagnosis not present

## 2014-07-17 DIAGNOSIS — I824Y9 Acute embolism and thrombosis of unspecified deep veins of unspecified proximal lower extremity: Secondary | ICD-10-CM | POA: Diagnosis not present

## 2014-07-17 DIAGNOSIS — Z7901 Long term (current) use of anticoagulants: Secondary | ICD-10-CM | POA: Diagnosis not present

## 2014-07-20 DIAGNOSIS — M6281 Muscle weakness (generalized): Secondary | ICD-10-CM | POA: Diagnosis not present

## 2014-07-20 DIAGNOSIS — Z7901 Long term (current) use of anticoagulants: Secondary | ICD-10-CM | POA: Diagnosis not present

## 2014-07-20 DIAGNOSIS — I10 Essential (primary) hypertension: Secondary | ICD-10-CM | POA: Diagnosis not present

## 2014-07-20 DIAGNOSIS — Z9181 History of falling: Secondary | ICD-10-CM | POA: Diagnosis not present

## 2014-07-20 DIAGNOSIS — I824Y9 Acute embolism and thrombosis of unspecified deep veins of unspecified proximal lower extremity: Secondary | ICD-10-CM | POA: Diagnosis not present

## 2014-07-27 LAB — CBC CANCER CENTER
BASOS PCT: 0.9 %
Basophil #: 0.1 x10 3/mm (ref 0.0–0.1)
EOS PCT: 1.3 %
Eosinophil #: 0.1 x10 3/mm (ref 0.0–0.7)
HCT: 42.5 % (ref 40.0–52.0)
HGB: 14 g/dL (ref 13.0–18.0)
LYMPHS ABS: 1 x10 3/mm (ref 1.0–3.6)
LYMPHS PCT: 13 %
MCH: 29.1 pg (ref 26.0–34.0)
MCHC: 33 g/dL (ref 32.0–36.0)
MCV: 88 fL (ref 80–100)
MONOS PCT: 7 %
Monocyte #: 0.5 x10 3/mm (ref 0.2–1.0)
NEUTROS PCT: 77.8 %
Neutrophil #: 5.9 x10 3/mm (ref 1.4–6.5)
PLATELETS: 155 x10 3/mm (ref 150–440)
RBC: 4.81 10*6/uL (ref 4.40–5.90)
RDW: 16.6 % — AB (ref 11.5–14.5)
WBC: 7.6 x10 3/mm (ref 3.8–10.6)

## 2014-07-27 LAB — CREATININE, SERUM
Creatinine: 1.67 mg/dL — ABNORMAL HIGH (ref 0.60–1.30)
EGFR (African American): 53 — ABNORMAL LOW
GFR CALC NON AF AMER: 46 — AB

## 2014-07-28 ENCOUNTER — Ambulatory Visit: Payer: Self-pay | Admitting: Internal Medicine

## 2014-07-28 DIAGNOSIS — I82409 Acute embolism and thrombosis of unspecified deep veins of unspecified lower extremity: Secondary | ICD-10-CM | POA: Diagnosis not present

## 2014-07-28 DIAGNOSIS — R1900 Intra-abdominal and pelvic swelling, mass and lump, unspecified site: Secondary | ICD-10-CM | POA: Diagnosis not present

## 2014-07-28 DIAGNOSIS — N19 Unspecified kidney failure: Secondary | ICD-10-CM | POA: Diagnosis not present

## 2014-07-28 LAB — KAPPA/LAMBDA FREE LIGHT CHAINS (ARMC)

## 2014-07-29 DIAGNOSIS — I10 Essential (primary) hypertension: Secondary | ICD-10-CM | POA: Diagnosis not present

## 2014-07-29 DIAGNOSIS — M6281 Muscle weakness (generalized): Secondary | ICD-10-CM | POA: Diagnosis not present

## 2014-07-29 DIAGNOSIS — Z9181 History of falling: Secondary | ICD-10-CM | POA: Diagnosis not present

## 2014-07-29 DIAGNOSIS — I824Y9 Acute embolism and thrombosis of unspecified deep veins of unspecified proximal lower extremity: Secondary | ICD-10-CM | POA: Diagnosis not present

## 2014-07-29 DIAGNOSIS — Z7901 Long term (current) use of anticoagulants: Secondary | ICD-10-CM | POA: Diagnosis not present

## 2014-08-11 DIAGNOSIS — N200 Calculus of kidney: Secondary | ICD-10-CM | POA: Diagnosis not present

## 2014-08-11 DIAGNOSIS — N201 Calculus of ureter: Secondary | ICD-10-CM | POA: Diagnosis not present

## 2014-08-11 LAB — CREATININE, SERUM
Creatinine: 1.72 mg/dL — ABNORMAL HIGH (ref 0.60–1.30)
EGFR (Non-African Amer.): 44 — ABNORMAL LOW
GFR CALC AF AMER: 51 — AB

## 2014-08-11 LAB — CALCIUM: Calcium, Total: 10.3 mg/dL — ABNORMAL HIGH (ref 8.5–10.1)

## 2014-08-19 DIAGNOSIS — E785 Hyperlipidemia, unspecified: Secondary | ICD-10-CM | POA: Diagnosis not present

## 2014-08-19 DIAGNOSIS — I82409 Acute embolism and thrombosis of unspecified deep veins of unspecified lower extremity: Secondary | ICD-10-CM | POA: Diagnosis not present

## 2014-08-19 DIAGNOSIS — I10 Essential (primary) hypertension: Secondary | ICD-10-CM | POA: Diagnosis not present

## 2014-08-27 ENCOUNTER — Ambulatory Visit: Payer: Self-pay | Admitting: Internal Medicine

## 2014-09-02 DIAGNOSIS — I82401 Acute embolism and thrombosis of unspecified deep veins of right lower extremity: Secondary | ICD-10-CM | POA: Diagnosis not present

## 2014-09-02 DIAGNOSIS — I82409 Acute embolism and thrombosis of unspecified deep veins of unspecified lower extremity: Secondary | ICD-10-CM | POA: Diagnosis not present

## 2014-09-16 DIAGNOSIS — I82401 Acute embolism and thrombosis of unspecified deep veins of right lower extremity: Secondary | ICD-10-CM | POA: Diagnosis not present

## 2015-03-09 ENCOUNTER — Ambulatory Visit
Admit: 2015-03-09 | Disposition: A | Discharge status: Home / Self Care | Payer: Self-pay | Attending: Internal Medicine | Admitting: Internal Medicine

## 2015-03-20 NOTE — Consult Note (Signed)
Brief Consult Note: Diagnosis: DVT.   Patient was seen by consultant.   Comments: DICTATED NOTE TO FOLLOW  SEEN EARLIER. ADMITTED WITH 2 WEEKS HX GROIN PAIN, THEN EXT EDEMA, EXTENSIVE CLOT DEMONSTRATED. PATIENT WITH PRIUOR CVC HAS OPPOSITE SIDE CHRONIC WEAKNESS, ADMITS HE IS USUALLY VEDRY LIMITED MOBILITY, STAYS IN BED, AND MORESO AFTER LEG BECAME PAINFULL. NO CP NO SOB. ON ADMISSION NOTED SOME LEUCOCYTOSIS LOOKS REACGTIVE, AZOTEMIA , SOME PRERENAL, POSSIBLY EFFECT OF CLOTTING, POSSIBLY DEHYDRATION, BASELINE UNKNOWN, NO OBSTRUCTION, ALSO HIGH CALCIUM, INITIALLY UNCLEAR IF CAUSE OR EFFECT OF HIGH CRE, GERTTING BETTER , LOOKS SPURRIOUS. 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  THIS CLOT THEN PRECIPITATED BY SIGNIFICANT IMMOBILUITY, BUT YOUNG AGE AND PRIOR CVA, MULTIPLE HYPERCOAG STUDIES OBTAINED, PLUS ELECTROPHORESIS FOR AZOTEMIA. INITIALLY, POS PHOSPHOLIPIDS, B2 GLYCOPROTEIN, AND LUPUS INHIBITOR. PROTEIN C AND S AND ATIII NEG, PROTHROMBIN MUTATION AND FACTOR V LIDEN PENDING. LT CHINS PENDING. SIEP OK.  Marland Kitchen.  PLAN..F/U LIDEN AND PROTHROMBIN MUTATION , SERUM LIGHT CHAINS.  LATER WILL REPEAT LUPUS INHIBITOR AND CARDIOLIPIN ANTIBODIES. WILL CHECK ANA . F/U SERUM CRE SERIALLY. LATER AGE APPROPRIATE CANCER SCREENING, PSA, AND COLONOSCOPY. CHECK  THE CEA AND CA 19/9, ALREADY ORDERED BUT PENDING.  DICTATED NOTE TO FOLLOW.  DEFER TO MEDICINE, RE CONTINUING ASA ALONG WITH HEPARIN. VASCULAR SURGERY HAS BEEN CONSULTED.  Electronic Signatures: Marin RobertsGittin, Floetta Brickey G (MD)  (Signed 16-Jul-15 19:42)  Authored: Brief Consult Note   Last Updated: 16-Jul-15 19:42 by Marin RobertsGittin, Cuahutemoc Attar G (MD)

## 2015-03-20 NOTE — H&P (Signed)
PATIENT NAME:  Douglas Lawson, Douglas Lawson MR#:  [id number] DATE OF BIRTH:  [birth year]  DATE OF ADMISSION:  06/09/2014  PRIMARY CARE PHYSICIAN: Elnita Maxwell A. Jamesetta Orleans, NP.  CHIEF COMPLAINT: Groin pain.   HISTORY OF PRESENT ILLNESS: A 54 year old male who presented with 2 weeks of right groin pain and lower extremity edema. CT of the abdomen and pelvis is performed in the ER, which showed a very large extensive DVT extending from the right leg into the iliac system, the  inferior vena cava to the level of the right atrium, also involving the right veins bilaterally, as well as an inferior mesenteric vein. The patient initially wanted to leave against medical advice. He has a power of attorney, which is his daughter, who convinced the patient to stay here. The patient does want to stay here. He is  going to be started on heparin drip. Dr. Gilda Crease, from vascular surgery has been called by the ER physician, and according to the ER physician at this time, there is no vascular procedure that can help with this extensive DJD.   REVIEW OF SYSTEMS: CONSTITUTIONAL:  No fever. Positive fatigue, weakness.   EYES:  No blurred or double vision, or ENT:  No ear pain, no hearing loss, RESPIRATORY:  No cough,PND,dyspnea, COPD. CARDIOVASCULAR: No chest pain, palpitations, syncope, edema, arrhythmia, dyspnea on exertion.  GASTROINTESTINAL:  No nausea, vomiting, diarrhea, abdominal pain, melena or ulcers.  GENITOURINARY:  No dysuria, hematuriaoliguria ENDOCRONE no polyphagia,polydipsia.  HEME  No easy bruising, bleeding, SKIN:  No rash or lesions. MUSCULOSKELETAL:  Limited activity due to his history of CVA.  NEUROLOGIC:  Positive history of CVA with left-sided weakness.  PSYCHIATRIC: No history of anxiety.   PAST MEDICAL HISTORY:   1.  Hyperlipidemia. 2.  Hypertension.  3.  CVA x2 with residual left-sided weakness.    PAST SURGICAL HISTORY: 1.  Right wrist surgery. 2.  Left knee surgery. 3.  Hernia repair.    ALLERGIES: PERCOCET CAUSES ITCHING BUT HE CAN TAKE MORPHINE AND DILAUDID.   MEDICATIONS:   1.  Tums 500 mg t.i.d.  2.  Simvastatin 20 mg daily.  3.  Lisinopril 20 mg daily.   SOCIAL HISTORY: The patient smokes over 2 packs a day, not interested in quitting. He was a former cocaine and crack user.  No alcohol or IV drug use.   FAMILY HISTORY: Unknown to the patient.   PHYSICAL EXAMINATION: VITAL SIGNS: Temperature is 98.4, pulse 82, respirations 20, blood pressure 104/68, 99% on room air.  GENERAL: The patient is alert, oriented, not in acute distress.  HEENT: Head is atraumatic.  Pupils are equally round.  Sclerae are anicteric. Mucous membranes are moist. Oropharynx is clear. NECK: Supple without JVD, carotid bruit, or lymphadenopathy.  CARDIOVASCULAR: Regular rate and rhythm. No murmurs, gallops, or rubs. PMI is not displaced.  LUNGS: Clear to auscultation without crackles, rales, or wheezing. Normal percussion.  ABDOMEN: Bowel sounds are positive, nontender, nondistended. No hepatosplenomegaly. No rebound or guarding.  EXTREMITIES: He has 2+ nonpitting edema in the right lower extremity with palpable pulses bilaterally.  SKIN:  Without rashes or lesions. NEUROLOGIC: The patient has 2/5 strength in the left upper extremity, 4/5 strength in the left upper extremity,  Cranial nerves II through XII are grossly intact.  There are no other focal deficits.   LABORATORIES: White blood cells 18, hemoglobin 14.9, hematocrit 44, platelets 170,000. Sodium 135, potassium 4.3, chloride 102, bicarbonate 27, BUN 32, creatinine 1.95. Glucose is 120, calcium 11.0, bilirubin 1.7, alkaline  phosphatase 74, ALT 21, AST 23, total protein 8.9, albumin 3.8. Troponin less than 0.02, INR 1.1.   RADIOLOGY:  CT of the abdomen and pelvis shows  extensive DVT extending from the right leg to inferior vena cava to the level of the right atrium and thrombus also involves the renal veins bilaterally as well as the  inferior mesenteric veins.  There is a suggestion of colonic narrowing of the rectosigmoid and there are some changes in the pelvis and it may be related to  lymphadenopathy as opposed to pelvic congestion related to the DVT.  Urinalysis is negative for LCE or nitrites   EKG:  Shows normal sinus rhythm. No ST elevations or depressions. He does have a left bundle branch block.   ASSESSMENT AND PLAN: This is a 54 year old male who presented with right groin pain and lower extremity edema, found to have a very extensive deep vein thrombosis involving his iliac system, all the way extending to the right atrium and bilateral kidneys.  1.  Extensive deep venous thrombosis. The patient is high risk for death due to his extensive DVT, which is discussed with him. Also  discussed by the ER physician to the daughter, who is the power of attorney, that the ER physician did speak with Dr. Gilda CreaseSchnier  who I have tried to page to also contact, but apparently according to the ER physician, after speaking with Dr. Gilda CreaseSchnier at this time, there is no vascular intervention. The patient has to be placed on a heparin drip, probably we can start Coumadin tomorrow or the next day. Overall, the patient has a poor prognosis. I have also order hypercoaguable panel.  2.  History of hypertension. Low-normal blood pressure, this extensive DVT and we are going to hold off on lisinopril, monitor blood pressure.  3.  History of cerebrovascular accident. The patient is on simvastatin. Unclear to me why he is not on aspirin, but we can add this to his regimen.  4.  Acute kidney injury.  Creatinine is 1.95. I suspect this is related to his large thrombus affecting the bilateral kidney system.  Will go ahead and repeat a BMP for the a.m. and monitor creatinine closely.  5.  Hypercalcemia, unclear etiology. Will provide IV fluids. If calcium is not improved by tomorrow,  may consider workup with SPEP and UPEP, to evaluate for myeloma.  6.   Leukocytosis likely just a stress reactant from the extensive deep vein thrombosis.  Urinalysis does not have any infection. The patient denies any fevers or chills.  Will just repeat his CBC  in the a.m.  No antibiotics at this time.   7.  The patient is full code status.    CRITICAL CARE TIME SPENT: Approximately 50 minutes.    Again, the patient with cardiopulmonary arrest, which was discussed with the patient.     ____________________________ Alex Mcmanigal P. Juliene PinaMody, MD spm:ds D: 06/09/2014 16:57:43 ET T: 06/09/2014 17:39:33 ET JOB#: [id number]  cc: Pebble Botkin P. Juliene PinaMody, MD, <Dictator> Janyth ContesSITAL P Hillman Attig MD ELECTRONICALLY SIGNED 06/09/2014 19:03

## 2015-03-20 NOTE — H&P (Signed)
PATIENT NAME:  Douglas Lawson, Douglas Lawson MR#:  [id number] DATE OF BIRTH:  1961/08/05  DATE OF ADMISSION:  06/09/2014  ADDENDUM: The patient was counseled on tobacco dependence. He smokes over 2 packs a day. He does not want to quit. He did say he quit for 8 years, but he is not interested at this time to quit smoking. He also does not want a nicotine patch. The patient was counseled for 3 minutes.    ____________________________ Janyth ContesSital P. Juliene PinaMody, MD spm:sb D: 06/09/2014 17:02:15 ET T: 06/09/2014 17:11:19 ET JOB#: [id number]  cc: Tabithia Stroder P. Juliene PinaMody, MD, <Dictator> Janyth ContesSITAL P Chrishun Scheer MD ELECTRONICALLY SIGNED 06/09/2014 17:58

## 2015-03-20 NOTE — Consult Note (Signed)
PATIENT NAME:  Douglas Lawson, Mourad H MR#:  [id number] DATE OF BIRTH:  08-May-1961  DATE OF CONSULTATION:  06/11/2014  REFERRING PHYSICIAN:   CONSULTING PHYSICIAN:  Knute Neuobert G. Lorre NickGittin, MD   HISTORY OF PRESENT ILLNESS:  Mr. Douglas Lawson is a 54 year old man who was admitted on July 14, I saw and evaluated him on July 16, a note was placed in the chart. His case was discussed with medicine at that time. These are the events and evaluation of that day, although this full dictation is actually rendered at a later time.   The patient had presented with 2 weeks of right groin pain and lower extremity edema, and a CT abdomen and pelvis was done in the ER. It showed a very extensive DVT extending all the way from the iliacs to the inferior vena cava, up to the right atrium, as well as inferior mesenteric vein. Vascular surgery advised there was no vascular intervention that should be performed. The patient's clot is felt be precipitated by immobility. He has a history of prior strokes, the weakness is on the opposite side, the left side, but he stays mostly immobile, and admitted that when he started to have some pain in his leg, he just did not move at  all, and then the pain progressed, so sedentary habit and then staying in bed after symptoms presented for a long time led to an extensive clot.   The patient's history is significant for hyperlipidemia, hypertension, and CVA x 2 with left-sided weakness. With regard to his stroke, the first stroke was due  to cocaine use. Second stroke, he remembers he developed weakness while he was sitting in an airplane, unclear if he had antecedent cocaine use before then.    SOCIAL HISTORY: Smokes 2 packs of cigarettes a day and does not want to quit.   SOCIAL HISTORY: As noted, formally used cocaine, crack, but no alcohol, no current drug use.  MEDICATIONS: Had been simvastatin 20 daily, lisinopril 20 daily, and Tums twice a day.   ALLERGIES: PERCOCET CAUSES ITCHING, BUT HE HAS NOT  HAD ANY ALLERGY REACTION TO MORPHINE OR DILAUDID.   PRIMARY CARE PHYSICIAN: His primary care is with Gabriel Cirriheryl Wicker, nurse practitioner.   FAMILY HISTORY: Unknown. Did not have any except possibly a family history with prostate cancer.   ADDITIONAL HISTORY AND SURGERIES: He had a hernia repair in the past.   SYSTEM REVIEW: When the patient was seen, he did not have headache or dizziness or chills or sweats, or visual disturbances, or ear or jaw pain, cough, wheezing, hemoptysis, palpitations or retrosternal chest pain, nausea, vomiting, diarrhea, or dysuria, polyuria or polydipsia. No easy bleeding or bruising. Had no rash. Had the left side weakness, which is old, as stated, and the pain and discomfort in right leg.   PHYSICAL EXAMINATION:  GENERAL: He was alert and cooperative.  HEENT: Sclerae clear. Mouth, no thrush.  LYMPHATICS: No palpable lymph nodes in the neck submandibular, or axilla.  LUNGS: Clear. No wheezing or rales.  ABDOMEN: Benign, nontender, no palpable mass or organomegaly.  EXTREMITIES: He had edema in the right leg, no rash. He has reduced strength in the left arm, slightly less reduced strength in the leg. He has weakness in the left side, upper more than lower.   LABORATORY DATA: July 2, he had had a laboratory result. His platelets were only 93,000. His white count was 17,000. On July 14, white count 18.3, hemoglobin 14.9, platelets 170,000. The creatinine was 1.95. The calcium  was elevated at 11. His total protein was 8.9. Troponin was less than 0.02. Urinalysis was unremarkable.   Already mentioned above, the CT abdomen and pelvis on July 14 showed extensive thrombosis with clot extending into the renal veins and into the mesenteric veins, extending into the inferior vena cava and into the right atrium. There was some suggestion of colonic narrowing at the rectosigmoid, or deep vein pelvis congestion, or possibly lymphadenopathy.   The PT/INR was 1.1, the PTT was  borderline at 39.7, anticardiolipin antibody was  IgG antibody was high at 46, and the lupus comprehensive anticoagulant was positive, considered to be consistent with the presence of a lupus anticoagulant. Protein C functional was normal, protein S functional was normal, and antithrombin 3 was normal. The factor V Leiden was negative. The prothrombin mutation was negative, and a beta-2 glycoprotein was also high. The IgG protein was high at 24. TSH was 2.04. An SIEP showed low IgM at 18, but otherwise unremarkable. No M spike.   White count on July 15 was down to 13.4. The platelets were 127,000, hemoglobin was 12.9. On July 15, the creatinine was 1.8. On July 16, the white count was down to 9.8. Hemoglobin was 12.2, the platelets were 171,000. The neutrophils are slightly elevated at 7.7. Creatinine improved to 1.51. An ANA available later was negative. The CEA was 1.5. PSA was 0.9. CA 19-9 was 25. Kappa light chains had a normal ratio, a trivial elevation of kappa and a normal lambda, the ratio was normal. July 17, neutrophils were normal. Hemoglobin was 11.7. The creatinine later came down to 1.34. These were also not available when the patient was evaluated on July 16.   IMPRESSION AND PLAN: From the hematology point of view, the patient has:   1. A lupus-like phospholipid positive, also a slightly prolonged PTT, which is likely due to that inhibitor, but now on anticoagulation, could not be tested. 2. Minimal leukocytosis, likely reactive. Needs to be followed.  3. Waxing and waning thrombocytopenia due to consumption, though spurious, should be followed.  4. History of strokes at a young age in the past. One was due to cocaine, and the second was potentially, but it was not clearly related  to cocaine, now extensive clot clearly due to immobility, none-the-less young age and unusual extensive clot. Felt that hypercoagulable studies should be done as they have been.   PLAN:  1. The patient will be  anticoagulated. Vascular surgery has declined other interventions. As an outpatient, I would want to repeat the CT scan and follow up the questionable sigmoid abnormality. Possibly the patient might need a sigmoidoscopy or colonoscopy, or at least appropriate age-related cancer screening after he has been anticoagulated for a few months. Serial CBCs to make sure that the neutrophils are normal.  2. Would follow the lupus inhibitor, AS SOMETIMES transient. Looks like this patient likely has a significant and probably will end up with persistent phospholipid antibodies.  3. Plans were made to follow in the cancer center discharge. I defer to medicine about whether or not aspirin should be stopped. We will continue with the stroke history. As stated, PSA, CEA and CA-19-9 were already documented.     ____________________________ Knute Neu. Lorre Nick, MD rgg:jr D: 06/29/2014 12:14:00 ET T: 06/29/2014 12:37:42 ET JOB#: [id number]  cc: Knute Neu. Lorre Nick, MD, <Dictator> Marin Roberts MD ELECTRONICALLY SIGNED 07/07/2014 23:54

## 2015-03-20 NOTE — Consult Note (Signed)
Brief Consult Note: Diagnosis: extensive DVT from the femoral level to the atrium.   Comments: No vascualr intervention indicated or possible for that matter recommend anticoagulation.  Electronic Signatures: Levora DredgeSchnier, Gregory (MD)  (Signed 16-Jul-15 10:34)  Authored: Brief Consult Note   Last Updated: 16-Jul-15 10:34 by Levora DredgeSchnier, Gregory (MD)

## 2015-03-20 NOTE — Discharge Summary (Signed)
PATIENT NAME:  Douglas Lawson, Douglas Lawson MR#:  [id number] DATE OF BIRTH:  20-Oct-1961  DATE OF ADMISSION:  06/09/2014 DATE OF DISCHARGE:  06/15/2014  ADMITTING DIAGNOSIS: Groin pain.   DISCHARGE DIAGNOSES:  1.  Groin pain due to extensive deep vein thrombosisright leg into the iliac system, inferior vena cava to the level of the right atrium.  Thrombus also in walls of renal veins bilaterally.  2.  Acute renal failure, improved with IV hydration. 3.  Hypercalcemia due to dehydration, improved with IV hydration. 4.  Leukocytosis, likely due to thrombus.  IMAGING:  CT scan of the chest with contrast showed left basal atelectasis, enlargement of the central venous structures due to collateral flow. No other focal abnormalities.   EKG: Normal sinus rhythm with a left bundle branch block.   PERTINENT LABS AND EVALUATIONS: Admitting glucose 120, BUN 33, creatinine 1.95, sodium 135, potassium 4.3, chloride 102, CO2 27, calcium 11.0. Total protein of 8.9, albumin 3.8, bilirubin total 1.7. WBC on presentation 18.3, hemoglobin 14.9, platelet count was 170,000. Urinalysis was negative. CA level currently pending. Prostate specific antigen 0.9.  CA-19-9 was 25. NT cardiolipin IgG level was elevated.  Antithrombin antigen level was elevated at 131%. ANA direct was negative. Lupus anticoagulant was present.   CT scan of the chest without contrast showed left basilar atelectasis, enlargement of some central venous structures.   CONSULTANTS DURING HOSPITALIZATION:  1.  Nephrology, Dr. Thedore Mins.  2.  Vascular, Dr. Gilda Crease.  3.  Hematologic,  Dr. Lorre Nick.   HOSPITAL COURSE: Please refer to H and P done by the admitting physician. The patient is a 54 year old white male who presented with 2 weeks of right groin pain. He, apparently due to his history of previous CVA, usually does not do much and stays in bed. Presented to the ED with these complaints and was noted to have extensive thrombosis as described above. Due to these  symptoms, he was admitted and further evaluation was pursued. He underwent treatment with heparin drip.  Due to the extensive nature of the clot, he was kept on heparin for a prolonged period of time until he was switched over to Xarelto. He was seen by hematology and vascular surgery. He will need an outpatient hematological follow-up.  In terms of vasculitis surgery, they did not feel that he would be a candidate for thrombosis  due to the extensive nature of the clot. The patient also had acute renal failure and was seen by nephrology. He was hydrated with improvement in his renal function. At this time, the patient is doing better and is close to his baseline in terms of his physical activity and was recommended by PT to go home with physical therapy.   MEDICATIONS AT THE TIME OF DISCHARGE:  1.  Simvastatin 20 at bedtime. 2.  TUMS 500 mg 1 tab p.o. t.i.d.  3.  Acetaminophen 325 two tabs q. 4 p.r.n. for pain. 4.  Xarelto 50 mg 1 tab p.o. b.i.d. x 21 days, then Xarelto 20 mg 1 tab p.o. daily after 21 days.  5.  Aspirin 81 mg 1 tab p.o. daily.   DIET: Low sodium.   ACTIVITY: As tolerated.  FOLLOWUP:  Follow with primary M.D. in 1-2 weeks. Follow with Dr. Lorre Nick in 1-2 weeks.   The patient is recommended to stop smoking. Also primary care provider needs to arrange for a GI follow-up due to abnormal narrowing noted on the rectosigmoid area. Patient had no GI symptoms.   TIME SPENT: 35 minutes.  ____________________________ Lacie ScottsShreyang H. Allena KatzPatel, MD shp:ts D: 06/17/2014 08:50:25 ET T: 06/17/2014 11:00:12 ET JOB#: [id number]  cc: Marijean Montanye H. Allena KatzPatel, MD, <Dictator> Charise CarwinSHREYANG H Kadasia Kassing MD ELECTRONICALLY SIGNED 06/17/2014 12:23

## 2015-03-26 LAB — CBC CANCER CENTER
BASOS PCT: 0.9 %
Basophil #: 0.1 x10 3/mm (ref 0.0–0.1)
Eosinophil #: 0.1 x10 3/mm (ref 0.0–0.7)
Eosinophil %: 0.7 %
HCT: 49 % (ref 40.0–52.0)
HGB: 16.4 g/dL (ref 13.0–18.0)
LYMPHS ABS: 1.3 x10 3/mm (ref 1.0–3.6)
LYMPHS PCT: 14.9 %
MCH: 30.5 pg (ref 26.0–34.0)
MCHC: 33.4 g/dL (ref 32.0–36.0)
MCV: 91 fL (ref 80–100)
MONO ABS: 0.6 x10 3/mm (ref 0.2–1.0)
MONOS PCT: 6.2 %
NEUTROS ABS: 6.9 x10 3/mm — AB (ref 1.4–6.5)
Neutrophil %: 77.3 %
PLATELETS: 295 x10 3/mm (ref 150–440)
RBC: 5.37 10*6/uL (ref 4.40–5.90)
RDW: 15.7 % — ABNORMAL HIGH (ref 11.5–14.5)
WBC: 8.9 x10 3/mm (ref 3.8–10.6)

## 2015-03-26 LAB — CALCIUM: Calcium, Total: 9.5 mg/dL

## 2015-03-26 LAB — CREATININE, SERUM
Creatinine: 1.98 mg/dL — ABNORMAL HIGH
EGFR (African American): 43 — ABNORMAL LOW
EGFR (Non-African Amer.): 37 — ABNORMAL LOW

## 2015-03-30 LAB — CANCER ANTIGEN 19-9: CA 19 9: 36 U/mL — AB (ref 0–35)

## 2015-03-30 LAB — KAPPA/LAMBDA FREE LIGHT CHAINS (ARMC)

## 2015-04-06 NOTE — Progress Notes (Unsigned)
PSN mailed to patient an application for assistance for the drug, Xarelto.  Patient to complete certain sections before mailing to the patient assistance program.

## 2015-05-30 ENCOUNTER — Other Ambulatory Visit: Payer: Self-pay | Admitting: Unknown Physician Specialty

## 2015-06-01 NOTE — Telephone Encounter (Signed)
Should have enough of both of these to get to September. Please check with pharmacy. Patient will also need follow up with Elnita Maxwellheryl prior to September.

## 2015-06-02 NOTE — Telephone Encounter (Signed)
Patient returned my call and was scheduled an appointment in August.

## 2015-06-02 NOTE — Telephone Encounter (Signed)
Called and left patient a voicemail to return my call and get a follow-up appointment scheduled.

## 2015-07-01 DIAGNOSIS — E785 Hyperlipidemia, unspecified: Secondary | ICD-10-CM

## 2015-07-01 DIAGNOSIS — F329 Major depressive disorder, single episode, unspecified: Secondary | ICD-10-CM | POA: Insufficient documentation

## 2015-07-01 DIAGNOSIS — I82401 Acute embolism and thrombosis of unspecified deep veins of right lower extremity: Secondary | ICD-10-CM

## 2015-07-01 DIAGNOSIS — N183 Chronic kidney disease, stage 3 unspecified: Secondary | ICD-10-CM

## 2015-07-01 DIAGNOSIS — I129 Hypertensive chronic kidney disease with stage 1 through stage 4 chronic kidney disease, or unspecified chronic kidney disease: Secondary | ICD-10-CM

## 2015-07-01 DIAGNOSIS — I639 Cerebral infarction, unspecified: Secondary | ICD-10-CM

## 2015-07-01 DIAGNOSIS — F32A Depression, unspecified: Secondary | ICD-10-CM

## 2015-07-01 DIAGNOSIS — I69364 Other paralytic syndrome following cerebral infarction affecting left non-dominant side: Secondary | ICD-10-CM

## 2015-07-05 ENCOUNTER — Ambulatory Visit (INDEPENDENT_AMBULATORY_CARE_PROVIDER_SITE_OTHER): Payer: PPO | Admitting: Unknown Physician Specialty

## 2015-07-05 ENCOUNTER — Encounter: Payer: Self-pay | Admitting: Unknown Physician Specialty

## 2015-07-05 VITALS — BP 123/85 | HR 80 | Ht 72.5 in | Wt 231.6 lb

## 2015-07-05 DIAGNOSIS — I82401 Acute embolism and thrombosis of unspecified deep veins of right lower extremity: Secondary | ICD-10-CM

## 2015-07-05 DIAGNOSIS — I129 Hypertensive chronic kidney disease with stage 1 through stage 4 chronic kidney disease, or unspecified chronic kidney disease: Secondary | ICD-10-CM | POA: Diagnosis not present

## 2015-07-05 DIAGNOSIS — N183 Chronic kidney disease, stage 3 unspecified: Secondary | ICD-10-CM

## 2015-07-05 DIAGNOSIS — E785 Hyperlipidemia, unspecified: Secondary | ICD-10-CM

## 2015-07-05 DIAGNOSIS — N189 Chronic kidney disease, unspecified: Secondary | ICD-10-CM

## 2015-07-05 DIAGNOSIS — I639 Cerebral infarction, unspecified: Secondary | ICD-10-CM | POA: Diagnosis not present

## 2015-07-05 MED ORDER — LISINOPRIL 10 MG PO TABS: 10.0000 mg | ORAL_TABLET | Freq: Every day | ORAL | Status: DC

## 2015-07-05 MED ORDER — CLOPIDOGREL BISULFATE 75 MG PO TABS: 75.0000 mg | ORAL_TABLET | Freq: Every day | ORAL | Status: DC

## 2015-07-05 MED ORDER — SIMVASTATIN 20 MG PO TABS: 20.0000 mg | ORAL_TABLET | Freq: Every day | ORAL | Status: DC

## 2015-07-05 NOTE — Assessment & Plan Note (Signed)
Continue present medications.  Check CMP

## 2015-07-05 NOTE — Progress Notes (Signed)
BP 123/85 mmHg  Pulse 80  Ht 6' 0.5" (1.842 m)  Wt 231 lb 9.6 oz (105.053 kg)  BMI 30.96 kg/m2  SpO2 99%   Subjective:    Patient ID: Douglas Lawson, male    DOB: Apr 06, 1961, 54 y.o.   MRN: [id number]  HPI: Douglas Lawson is a 54 y.o. male  Chief Complaint  Patient presents with  . Hyperlipidemia  . Hypertension   1.  HYPERTENSION / HYPERLIPIDEMIA Patient is requesting refill(s). Satisfied with current treatment?  yes  H6  Duration of hypertension:  chronic  H4  BP monitoring frequency:  not checking.  H5  BP medication side effects:  no P1 Aspirin:  Aspirin Therapy Done on 05/02/10(Not taking aspirin)  Recent stressors:  no  H6   Recurrent headaches:  yes  R10 Visual changes:  no  R2  Palpitations:  no  R4  Dyspnea:  no  R5  Chest pain:  no  R4  Lower extremity edema:  no  R4  Dizzy/lightheaded:  yes  R10  Review of notes noted low GFR last visit.    2.  DVT Refusing referral to hematology and anti-coagulant therapy of any kind.    3.  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  Last visit, after reviewing neurology notes, I started him on Plavix.  He states he is taking "that last pill you gave me."    Relevant past medical, surgical, family and social history reviewed and updated as indicated. Interim medical history since our last visit reviewed. Allergies and medications reviewed and updated.  Review of Systems  Per HPI unless specifically indicated above     Objective:    BP 123/85 mmHg  Pulse 80  Ht 6' 0.5" (1.842 m)  Wt 231 lb 9.6 oz (105.053 kg)  BMI 30.96 kg/m2  SpO2 99%  Wt Readings from Last 3 Encounters:  07/05/15 231 lb 9.6 oz (105.053 kg)  09/02/14 217 lb (98.431 kg)    Physical Exam  Constitutional: He is oriented to person, place, and time. He appears well-developed and well-nourished. No distress.  HENT:  Head: Normocephalic and atraumatic.  Eyes: Conjunctivae and lids are normal. Right eye exhibits no  discharge. Left eye exhibits no discharge. No scleral icterus.  Cardiovascular: Normal rate, regular rhythm and normal heart sounds.   Pulmonary/Chest: Effort normal and breath sounds normal. No respiratory distress.  Abdominal: Normal appearance. There is no splenomegaly or hepatomegaly.  Musculoskeletal: Normal range of motion.  Neurological: He is alert and oriented to person, place, and time.  Skin: Skin is intact. No rash noted. No pallor.  Psychiatric: He has a normal mood and affect. His behavior is normal. Judgment and thought content normal.       Assessment & Plan:   Problem List Items Addressed This Visit      Unprioritized   CVA (cerebral infarction)    Continue Plavix      Hyperlipidemia    Check Lipid panel.  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  Check CMP      Relevant Medications   lisinopril (PRINIVIL,ZESTRIL) 10 MG tablet   simvastatin (ZOCOR) 20 MG tablet   Other Relevant Orders   Comprehensive metabolic panel   Lipid Panel w/o Chol/HDL Ratio       Follow up plan: Return in about 6 months (around 01/05/2016).

## 2015-07-05 NOTE — Assessment & Plan Note (Signed)
Check Lipid panel.  Continue Simvistatin

## 2015-07-05 NOTE — Assessment & Plan Note (Signed)
Refusing hematology referral and anti-coagulant therapy

## 2015-07-05 NOTE — Assessment & Plan Note (Signed)
Continue Plavix.  ?

## 2015-07-06 ENCOUNTER — Other Ambulatory Visit: Payer: Self-pay | Admitting: Unknown Physician Specialty

## 2015-07-06 ENCOUNTER — Encounter: Payer: Self-pay | Admitting: Unknown Physician Specialty

## 2015-07-06 DIAGNOSIS — N183 Chronic kidney disease, stage 3 unspecified: Secondary | ICD-10-CM

## 2015-07-06 LAB — COMPREHENSIVE METABOLIC PANEL
ALT: 25 IU/L (ref 0–44)
AST: 19 IU/L (ref 0–40)
Albumin/Globulin Ratio: 1.8 (ref 1.1–2.5)
Albumin: 4.4 g/dL (ref 3.5–5.5)
Alkaline Phosphatase: 54 IU/L (ref 39–117)
BILIRUBIN TOTAL: 0.5 mg/dL (ref 0.0–1.2)
BUN / CREAT RATIO: 17 (ref 9–20)
BUN: 27 mg/dL — ABNORMAL HIGH (ref 6–24)
CHLORIDE: 103 mmol/L (ref 97–108)
CO2: 20 mmol/L (ref 18–29)
Calcium: 10 mg/dL (ref 8.7–10.2)
Creatinine, Ser: 1.61 mg/dL — ABNORMAL HIGH (ref 0.76–1.27)
GFR calc Af Amer: 55 mL/min/{1.73_m2} — ABNORMAL LOW (ref >59)
GFR, EST NON AFRICAN AMERICAN: 47 mL/min/{1.73_m2} — AB (ref >59)
Globulin, Total: 2.5 g/dL (ref 1.5–4.5)
Glucose: 98 mg/dL (ref 65–99)
Potassium: 4.6 mmol/L (ref 3.5–5.2)
Sodium: 140 mmol/L (ref 134–144)
Total Protein: 6.9 g/dL (ref 6.0–8.5)

## 2015-07-06 LAB — LIPID PANEL W/O CHOL/HDL RATIO
Cholesterol, Total: 178 mg/dL (ref 100–199)
HDL: 32 mg/dL — ABNORMAL LOW (ref >39)
TRIGLYCERIDES: 450 mg/dL — AB (ref 0–149)

## 2015-07-06 NOTE — Progress Notes (Signed)
Left message for patient and wrote letter about low GFR.  Refer to Nephrology

## 2015-07-30 IMAGING — CT CT ABD-PELV W/ CM
2 of 5 series · 15 of 46 positions shown, 17 images · IV contrast (agent unspecified)
Comparison: None.

CLINICAL DATA: Right lower quadrant pain

EXAM:
CT ABDOMEN AND PELVIS WITH CONTRAST
TECHNIQUE: Multidetector CT imaging of the abdomen and pelvis was performed
using the standard protocol following bolus administration of
intravenous contrast.
CONTRAST:  85 mL [id number]

[Series 2: routine abd pel with · axial · 0.79mm/px · z∈[-1060,-570]mm · 12 of 110 slices shown, 14 images]
[im 6/110  soft-tissue]
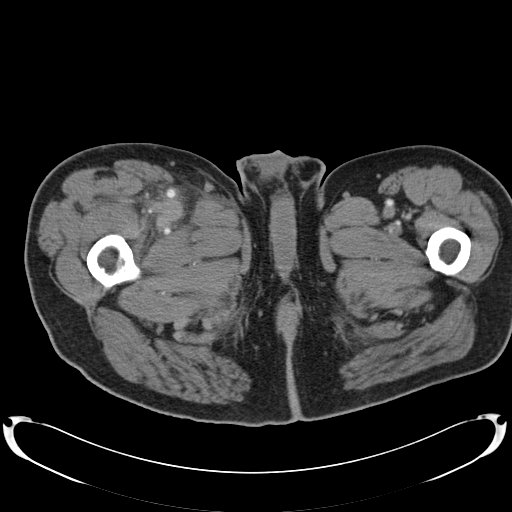
[im 6/110  bone]
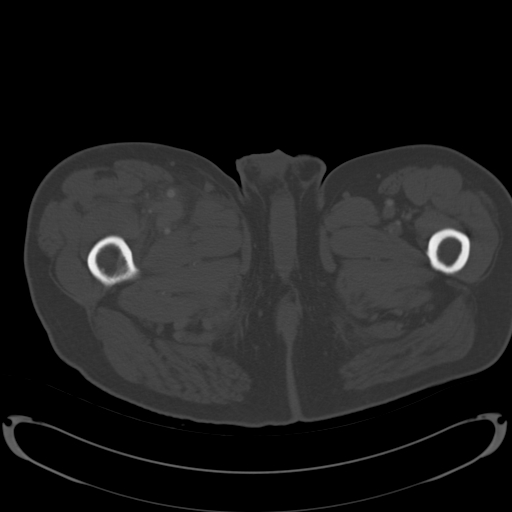
[im 17/110  soft-tissue]
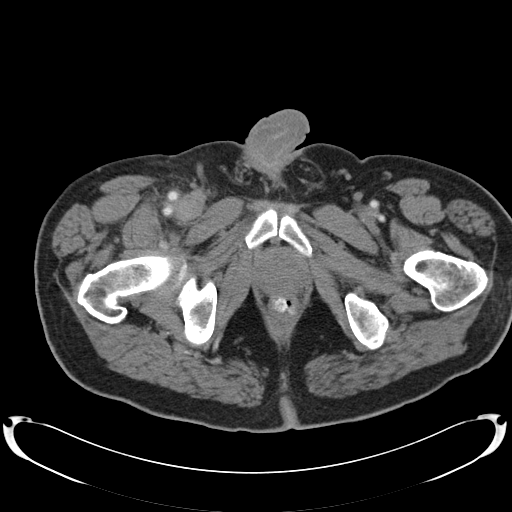
[im 22/110  soft-tissue]
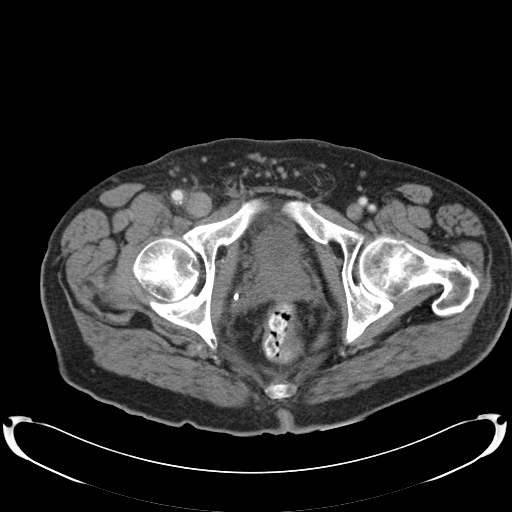
[im 33/110  soft-tissue]
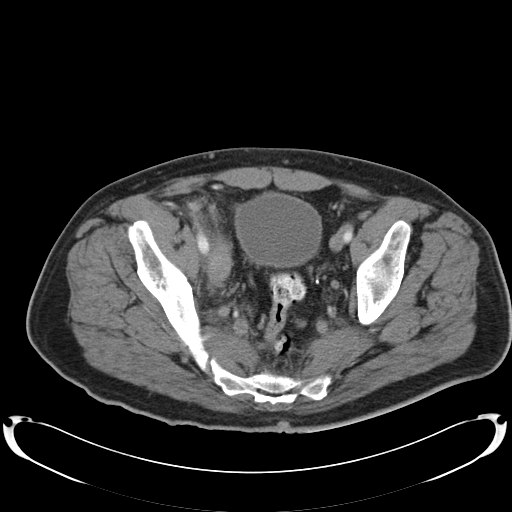
[im 44/110  soft-tissue]
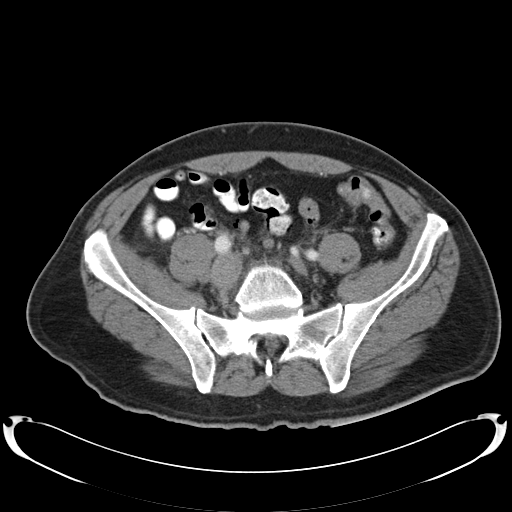
[im 50/110  soft-tissue]
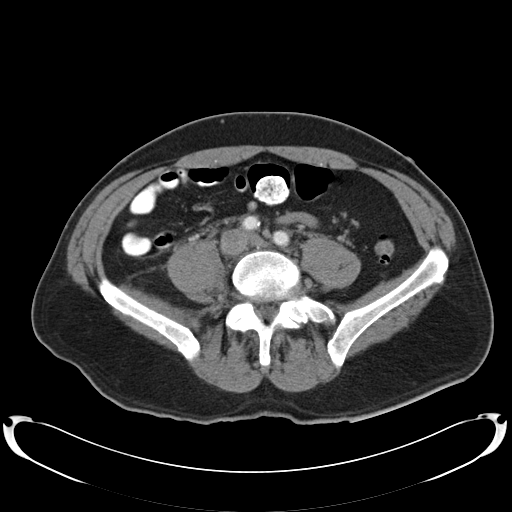
[im 60/110  soft-tissue]
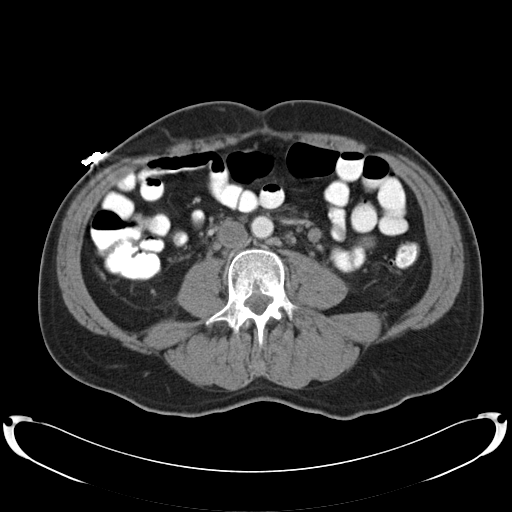
[im 66/110  soft-tissue]
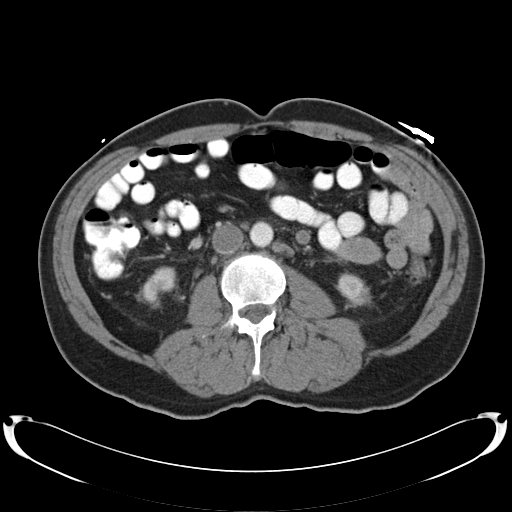
[im 77/110  soft-tissue]
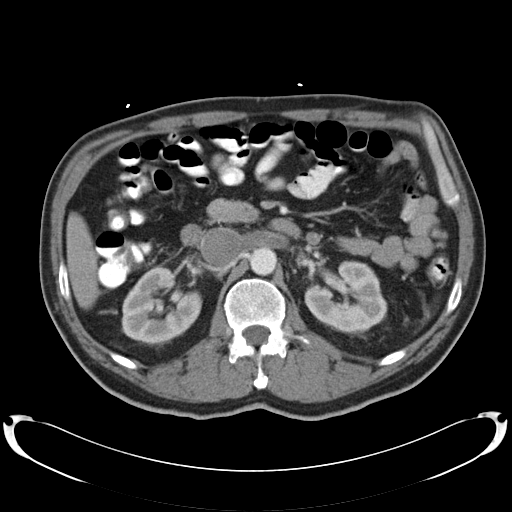
[im 77/110  bone]
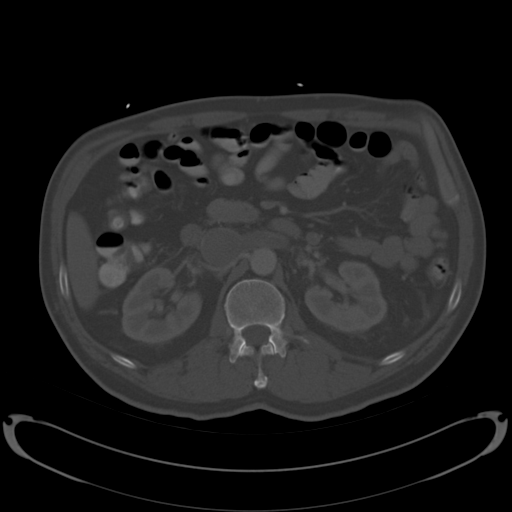
[im 88/110  soft-tissue]
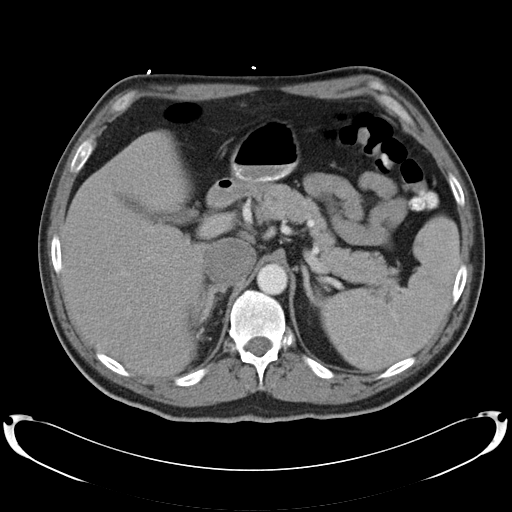
[im 93/110  soft-tissue]
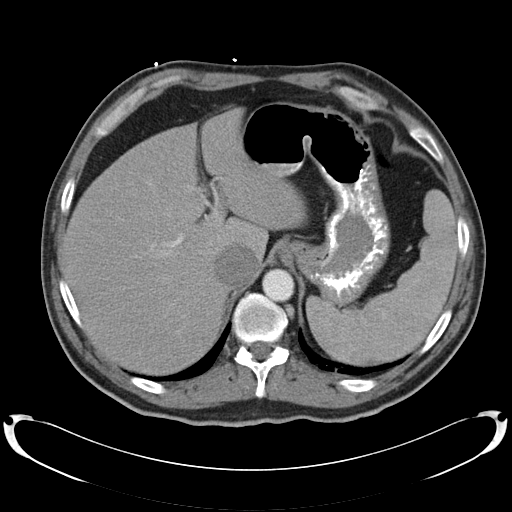
[im 104/110  soft-tissue]
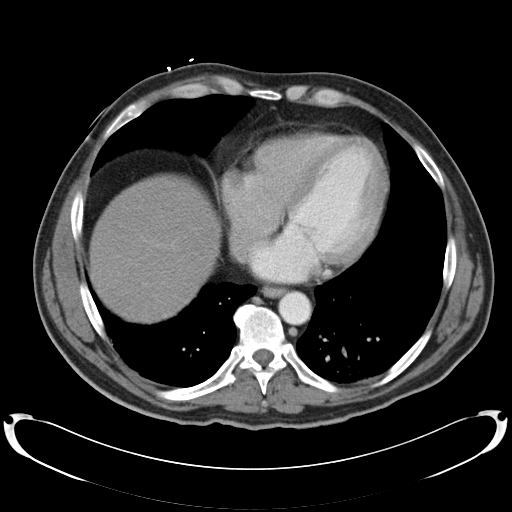

[Series 6: cor routine abd pel with · coronal · 0.91mm/px · 3 of 167 slices shown]
[im 56/167  soft-tissue]
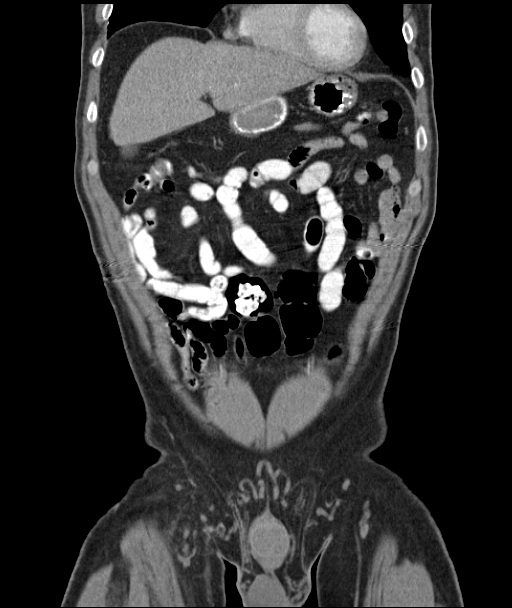
[im 74/167  soft-tissue]
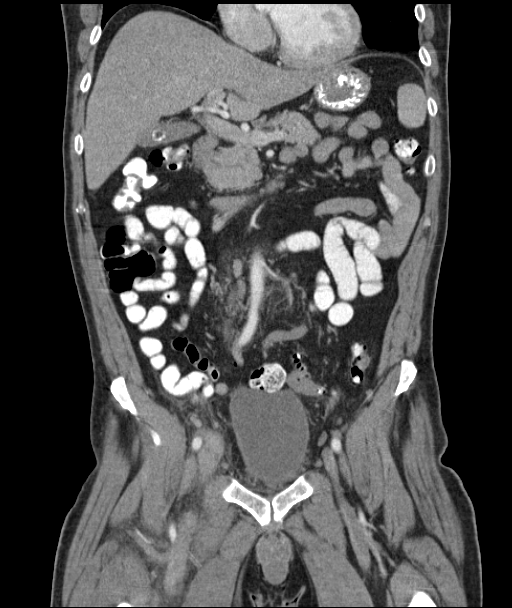
[im 93/167  soft-tissue]
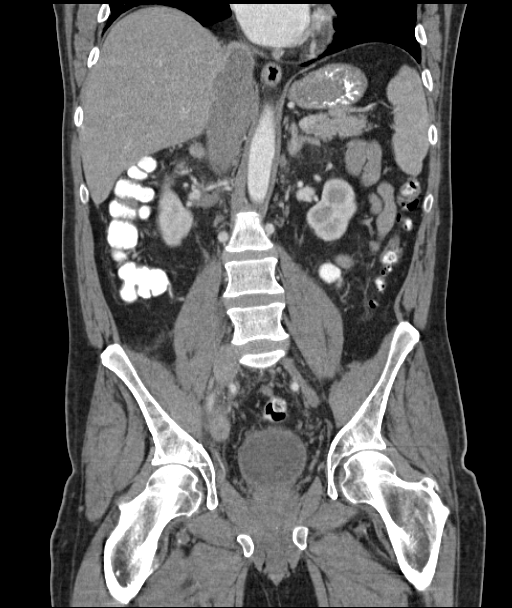

[15 of 46 positions shown; findings below may reference images not displayed]

FINDINGS: Lung bases are free of acute infiltrate or sizable effusion. The
liver is diffusely fatty infiltrated. The spleen, adrenal glands
kidneys are within normal limits. The delayed images through the
kidneys show normal excretion of contrast material. The gallbladder
is well distended and demonstrates multiple gallstones without
complicating factors.

There is extensive deep venous thrombosis identified extending from
the inferior most imaged portion of the right leg to include both
the superficial femoral and profunda femoral veins and extending
superiorly through the iliac (internal and external) system and into
the inferior vena cava with clot extending to the inferior aspect of
the right atrium. Significant expansion of the intrahepatic IVC is
noted. There is extension of thrombus into the renal veins
bilaterally although the kidneys appear to function in a normal
fashion. Collateral flow from the left kidney into the left
testicular vein and lumbar venous system is noted. Some mild
collateral flow is noted from the right kidney into the right
testicular vein and lumbar system. Additionally there is decreased
attenuation of the inferior mesenteric vein consistent with
thrombus. There is likely some degree of thrombus within the left
iliac and femoral system although incompletely evaluated on this
exam.

Some pelvic congestion is seen consistent with the diffuse deep
venous thrombosis. The bladder is well distended. No pelvic mass
lesion is seen.

The appendix is within normal limits. Scattered diverticular change
is seen. There is an area of narrowing within the rectosigmoid
region best seen on image number 78 of series 2. This may be related
to decompression although the possibility of an underlying colonic
lesion cannot be totally excluded on the basis of this exam. It is
possible that some of the changes in the pelvis contribute it to
pelvic congestion are related to small lymph nodes. Direct
visualization of the distal colon may be helpful.

The bony structures show degenerative change of the lumbar spine. No
metastatic lesions are seen.
IMPRESSION: Extensive deep venous thrombosis extending from the right leg into
the iliac system, and inferior vena cava to the level of the right
atrium. The thrombus also involves the renal veins bilaterally as
well as the inferior mesenteric vein.

Cholelithiasis.

Suggestion of colonic narrowing at the rectosigmoid as described.
Some changes in the pelvis may be related to lymphadenopathy as
opposed to pelvic congestion related to the deep venous thrombosis.
Direct visualization may be helpful.

Critical Value/emergent results were called by telephone at the time
of interpretation on 06/09/2014 at [DATE] to Dr. MIYA SANABIA ,
who verbally acknowledged these results.

## 2016-01-05 ENCOUNTER — Ambulatory Visit (INDEPENDENT_AMBULATORY_CARE_PROVIDER_SITE_OTHER): Payer: PPO | Admitting: Unknown Physician Specialty

## 2016-01-05 ENCOUNTER — Encounter: Payer: Self-pay | Admitting: Unknown Physician Specialty

## 2016-01-05 VITALS — BP 136/89 | HR 83 | Temp 97.5°F | Ht 73.1 in | Wt 246.2 lb

## 2016-01-05 DIAGNOSIS — E785 Hyperlipidemia, unspecified: Secondary | ICD-10-CM

## 2016-01-05 DIAGNOSIS — N183 Chronic kidney disease, stage 3 unspecified: Secondary | ICD-10-CM

## 2016-01-05 DIAGNOSIS — I129 Hypertensive chronic kidney disease with stage 1 through stage 4 chronic kidney disease, or unspecified chronic kidney disease: Secondary | ICD-10-CM

## 2016-01-05 DIAGNOSIS — M545 Low back pain, unspecified: Secondary | ICD-10-CM

## 2016-01-05 MED ORDER — CLOPIDOGREL BISULFATE 75 MG PO TABS: 75.0000 mg | ORAL_TABLET | Freq: Every day | ORAL | Status: AC

## 2016-01-05 MED ORDER — SIMVASTATIN 20 MG PO TABS: 20.0000 mg | ORAL_TABLET | Freq: Every day | ORAL | Status: DC

## 2016-01-05 MED ORDER — LISINOPRIL 10 MG PO TABS: 10.0000 mg | ORAL_TABLET | Freq: Every day | ORAL | Status: DC

## 2016-01-05 NOTE — Progress Notes (Signed)
BP 136/89 mmHg  Pulse 83  Temp(Src) 97.5 F (36.4 C)  Ht 6' 1.1" (1.857 m)  Wt 246 lb 3.2 oz (111.676 kg)  BMI 32.38 kg/m2  SpO2 100%   Subjective:    Patient ID: Douglas Lawson, male    DOB: Nov 11, 1961, 55 y.o.   MRN: [id number]  HPI: Douglas Lawson is a 55 y.o. male  Chief Complaint  Patient presents with  . Hyperlipidemia  . Hypertension  . Back Pain    pt states his back has been bothering him for a while now   Hypertension Using medications without difficulty Average home BPs   No problems or lightheadedness No chest pain with exertion or shortness of breath No Edema Reviewed notes noting GFR of 47 in August.     Hyperlipidemia Using medications without problems: No Muscle aches except for lower back pain Diet compliance:watches what he eats.   Exercise: none  Low back pain For about 1 month.  Takes OTC NSAIDS which helps at times.  Difficulty getting up and down, especially out of bed.      Relevant past medical, surgical, family and social history reviewed and updated as indicated. Interim medical history since our last visit reviewed. Allergies and medications reviewed and updated.  Review of Systems  Per HPI unless specifically indicated above     Objective:    BP 136/89 mmHg  Pulse 83  Temp(Src) 97.5 F (36.4 C)  Ht 6' 1.1" (1.857 m)  Wt 246 lb 3.2 oz (111.676 kg)  BMI 32.38 kg/m2  SpO2 100%  Wt Readings from Last 3 Encounters:  01/05/16 246 lb 3.2 oz (111.676 kg)  07/05/15 231 lb 9.6 oz (105.053 kg)  09/02/14 217 lb (98.431 kg)    Physical Exam  Constitutional: He is oriented to person, place, and time. He appears well-developed and well-nourished. No distress.  HENT:  Head: Normocephalic and atraumatic.  Eyes: Conjunctivae and lids are normal. Right eye exhibits no discharge. Left eye exhibits no discharge. No scleral icterus.  Neck: Normal range of motion. Neck supple. No JVD present. Carotid bruit is not present.   Cardiovascular: Normal rate, regular rhythm and normal heart sounds.   Pulmonary/Chest: Effort normal and breath sounds normal. No respiratory distress.  Abdominal: Normal appearance. There is no splenomegaly or hepatomegaly.  Musculoskeletal:       Lumbar back: He exhibits decreased range of motion, tenderness and pain. He exhibits no bony tenderness, no swelling, no edema and no deformity.  Neurological: He is alert and oriented to person, place, and time.  Skin: Skin is warm, dry and intact. No rash noted. No pallor.  Psychiatric: He has a normal mood and affect. His behavior is normal. Judgment and thought content normal.    Results for orders placed or performed in visit on 07/05/15  Comprehensive metabolic panel  Result Value Ref Range   Glucose 98 65 - 99 mg/dL   BUN 27 (H) 6 - 24 mg/dL   Creatinine, Ser 4.09 (H) 0.76 - 1.27 mg/dL   GFR calc non Af Amer 47 (L) >59 mL/min/1.73   GFR calc Af Amer 55 (L) >59 mL/min/1.73   BUN/Creatinine Ratio 17 9 - 20   Sodium 140 134 - 144 mmol/L   Potassium 4.6 3.5 - 5.2 mmol/L   Chloride 103 97 - 108 mmol/L   CO2 20 18 - 29 mmol/L   Calcium 10.0 8.7 - 10.2 mg/dL   Total Protein 6.9 6.0 - 8.5 g/dL  Albumin 4.4 3.5 - 5.5 g/dL   Globulin, Total 2.5 1.5 - 4.5 g/dL   Albumin/Globulin Ratio 1.8 1.1 - 2.5   Bilirubin Total 0.5 0.0 - 1.2 mg/dL   Alkaline Phosphatase 54 39 - 117 IU/L   AST 19 0 - 40 IU/L   ALT 25 0 - 44 IU/L  Lipid Panel w/o Chol/HDL Ratio  Result Value Ref Range   Cholesterol, Total 178 100 - 199 mg/dL   Triglycerides 960 (H) 0 - 149 mg/dL   HDL 32 (L) >45 mg/dL   VLDL Cholesterol Cal Comment 5 - 40 mg/dL   LDL Calculated Comment 0 - 99 mg/dL      Assessment & Plan:   Problem List Items Addressed This Visit      Unprioritized   Hyperlipidemia    Check Lipid panel.  Note pt is non-fasting.        Relevant Medications   aspirin 81 MG tablet   lisinopril (PRINIVIL,ZESTRIL) 10 MG tablet   simvastatin (ZOCOR) 20 MG  tablet   Other Relevant Orders   Lipid Panel w/o Chol/HDL Ratio   Hypertensive CKD (chronic kidney disease) - Primary    BP under good control.  Check CMP      Relevant Orders   Comprehensive metabolic panel   CKD (chronic kidney disease), stage III    Check CMP      Relevant Orders   Comprehensive metabolic panel    Other Visit Diagnoses    Bilateral low back pain without sciatica        Avoid NSAIDS due to CKD.  Refusing PT. I will give exercises to help    Relevant Medications    aspirin 81 MG tablet    Other Relevant Orders    DG Lumbar Spine Complete        Follow up plan: Return in about 6 months (around 07/04/2016).

## 2016-01-05 NOTE — Patient Instructions (Signed)

## 2016-01-05 NOTE — Assessment & Plan Note (Signed)
Check CMP.  ?

## 2016-01-05 NOTE — Assessment & Plan Note (Signed)
BP under good control.  Check CMP

## 2016-01-05 NOTE — Assessment & Plan Note (Signed)
Check Lipid panel.  Note pt is non-fasting.

## 2016-01-06 LAB — COMPREHENSIVE METABOLIC PANEL
ALBUMIN: 4.5 g/dL (ref 3.5–5.5)
ALK PHOS: 53 IU/L (ref 39–117)
ALT: 21 IU/L (ref 0–44)
AST: 16 IU/L (ref 0–40)
Albumin/Globulin Ratio: 2 (ref 1.1–2.5)
BUN / CREAT RATIO: 16 (ref 9–20)
BUN: 24 mg/dL (ref 6–24)
Bilirubin Total: 0.6 mg/dL (ref 0.0–1.2)
CO2: 24 mmol/L (ref 18–29)
CREATININE: 1.46 mg/dL — AB (ref 0.76–1.27)
Calcium: 9.4 mg/dL (ref 8.7–10.2)
Chloride: 104 mmol/L (ref 96–106)
GFR calc non Af Amer: 53 mL/min/{1.73_m2} — ABNORMAL LOW (ref >59)
GFR, EST AFRICAN AMERICAN: 62 mL/min/{1.73_m2} (ref >59)
Globulin, Total: 2.3 g/dL (ref 1.5–4.5)
Glucose: 102 mg/dL — ABNORMAL HIGH (ref 65–99)
Potassium: 4.3 mmol/L (ref 3.5–5.2)
Sodium: 144 mmol/L (ref 134–144)
Total Protein: 6.8 g/dL (ref 6.0–8.5)

## 2016-01-06 LAB — LIPID PANEL W/O CHOL/HDL RATIO
Cholesterol, Total: 157 mg/dL (ref 100–199)
HDL: 33 mg/dL — ABNORMAL LOW (ref >39)
LDL CALC: 59 mg/dL (ref 0–99)
Triglycerides: 323 mg/dL — ABNORMAL HIGH (ref 0–149)
VLDL CHOLESTEROL CAL: 65 mg/dL — AB (ref 5–40)

## 2016-01-07 ENCOUNTER — Encounter: Payer: Self-pay | Admitting: Unknown Physician Specialty

## 2016-07-04 ENCOUNTER — Ambulatory Visit: Payer: PPO | Admitting: Unknown Physician Specialty

## 2016-07-11 ENCOUNTER — Ambulatory Visit (INDEPENDENT_AMBULATORY_CARE_PROVIDER_SITE_OTHER): Payer: PPO | Admitting: Unknown Physician Specialty

## 2016-07-11 ENCOUNTER — Encounter: Payer: Self-pay | Admitting: Unknown Physician Specialty

## 2016-07-11 VITALS — BP 128/88 | HR 85 | Temp 97.8°F | Ht 73.0 in | Wt 236.0 lb

## 2016-07-11 DIAGNOSIS — F329 Major depressive disorder, single episode, unspecified: Secondary | ICD-10-CM

## 2016-07-11 DIAGNOSIS — I129 Hypertensive chronic kidney disease with stage 1 through stage 4 chronic kidney disease, or unspecified chronic kidney disease: Secondary | ICD-10-CM | POA: Diagnosis not present

## 2016-07-11 DIAGNOSIS — N189 Chronic kidney disease, unspecified: Secondary | ICD-10-CM

## 2016-07-11 DIAGNOSIS — E785 Hyperlipidemia, unspecified: Secondary | ICD-10-CM | POA: Diagnosis not present

## 2016-07-11 DIAGNOSIS — F32A Depression, unspecified: Secondary | ICD-10-CM

## 2016-07-11 NOTE — Assessment & Plan Note (Addendum)
Discussed.  Refusing treatment for now.  Encouraged counseling

## 2016-07-11 NOTE — Progress Notes (Signed)
BP 128/88 (BP Location: Left Arm, Patient Position: Sitting, Cuff Size: Large)   Pulse 85   Temp 97.8 F (36.6 C)   Ht 6\' 1"  (1.854 m) Comment: pt had shoes on  Wt 236 lb (107 kg) Comment: pt had shoes on  SpO2 97%   BMI 31.14 kg/m    Subjective:    Patient ID: Douglas Lawson, male    DOB: 08/13/1961, 55 y.o.   MRN: [id number]  HPI: Douglas CurryCharles H Jurgens is a 55 y.o. male  Chief Complaint  Patient presents with  . Hyperlipidemia  . Hypertension   Hypertension Using medications without difficulty Average home BPs   No problems or lightheadedness No chest pain with exertion or shortness of breath No Edema  Hyperlipidemia Using medications without problems: No Muscle aches  Diet compliance: diet Exercise:  Depression screen PHQ 2/9 07/11/2016  Decreased Interest 2  Down, Depressed, Hopeless 2  PHQ - 2 Score 4  Altered sleeping 0  Tired, decreased energy 2  Change in appetite 0  Feeling bad or failure about yourself  0  Trouble concentrating 0  Moving slowly or fidgety/restless 0  Suicidal thoughts 0  PHQ-9 Score 6    Relevant past medical, surgical, family and social history reviewed and updated as indicated. Interim medical history since our last visit reviewed. Allergies and medications reviewed and updated.  Review of Systems  Per HPI unless specifically indicated above     Objective:    BP 128/88 (BP Location: Left Arm, Patient Position: Sitting, Cuff Size: Large)   Pulse 85   Temp 97.8 F (36.6 C)   Ht 6\' 1"  (1.854 m) Comment: pt had shoes on  Wt 236 lb (107 kg) Comment: pt had shoes on  SpO2 97%   BMI 31.14 kg/m   Wt Readings from Last 3 Encounters:  07/11/16 236 lb (107 kg)  01/05/16 246 lb 3.2 oz (111.7 kg)  07/05/15 231 lb 9.6 oz (105.1 kg)    Physical Exam  Constitutional: He is oriented to person, place, and time. He appears well-developed and well-nourished. He does not appear ill. No distress.  HENT:  Head: Normocephalic and atraumatic.   Eyes: Conjunctivae and lids are normal. Right eye exhibits no discharge. Left eye exhibits no discharge. No scleral icterus.  Neck: Normal range of motion. Neck supple. No JVD present. Carotid bruit is not present.  Cardiovascular: Normal rate, regular rhythm and normal heart sounds.   Pulmonary/Chest: Effort normal and breath sounds normal. No respiratory distress.  Abdominal: Normal appearance. There is no splenomegaly or hepatomegaly.  Musculoskeletal: Normal range of motion.  Neurological: He is alert and oriented to person, place, and time.  Skin: Skin is warm, dry and intact. No rash noted. No pallor.  Psychiatric: He has a normal mood and affect. His behavior is normal. Judgment and thought content normal.    Results for orders placed or performed in visit on 01/05/16  Comprehensive metabolic panel  Result Value Ref Range   Glucose 102 (H) 65 - 99 mg/dL   BUN 24 6 - 24 mg/dL   Creatinine, Ser 2.201.46 (H) 0.76 - 1.27 mg/dL   GFR calc non Af Amer 53 (L) >59 mL/min/1.73   GFR calc Af Amer 62 >59 mL/min/1.73   BUN/Creatinine Ratio 16 9 - 20   Sodium 144 134 - 144 mmol/L   Potassium 4.3 3.5 - 5.2 mmol/L   Chloride 104 96 - 106 mmol/L   CO2 24 18 - 29 mmol/L  Calcium 9.4 8.7 - 10.2 mg/dL   Total Protein 6.8 6.0 - 8.5 g/dL   Albumin 4.5 3.5 - 5.5 g/dL   Globulin, Total 2.3 1.5 - 4.5 g/dL   Albumin/Globulin Ratio 2.0 1.1 - 2.5   Bilirubin Total 0.6 0.0 - 1.2 mg/dL   Alkaline Phosphatase 53 39 - 117 IU/L   AST 16 0 - 40 IU/L   ALT 21 0 - 44 IU/L  Lipid Panel w/o Chol/HDL Ratio  Result Value Ref Range   Cholesterol, Total 157 100 - 199 mg/dL   Triglycerides 161323 (H) 0 - 149 mg/dL   HDL 33 (L) >09>39 mg/dL   VLDL Cholesterol Cal 65 (H) 5 - 40 mg/dL   LDL Calculated 59 0 - 99 mg/dL      Assessment & Plan:   Problem List Items Addressed This Visit      Unprioritized   Benign hypertension with chronic kidney disease - Primary   Relevant Orders   Comprehensive metabolic panel    Chronic depression    Discussed.  Refusing treatment for now.  Encouraged counseling      Hyperlipidemia    Stable, continue present medications.         Other Visit Diagnoses   None.      Follow up plan: Return in about 6 months (around 01/11/2017) for physical+.

## 2016-07-11 NOTE — Assessment & Plan Note (Signed)
Stable, continue present medications.   

## 2016-07-12 ENCOUNTER — Telehealth: Payer: Self-pay | Admitting: Unknown Physician Specialty

## 2016-07-12 DIAGNOSIS — N183 Chronic kidney disease, stage 3 unspecified: Secondary | ICD-10-CM

## 2016-07-12 LAB — COMPREHENSIVE METABOLIC PANEL WITH GFR
ALT: 22 IU/L (ref 0–44)
AST: 20 IU/L (ref 0–40)
Albumin/Globulin Ratio: 1.8 (ref 1.2–2.2)
Albumin: 4.8 g/dL (ref 3.5–5.5)
Alkaline Phosphatase: 53 IU/L (ref 39–117)
BUN/Creatinine Ratio: 15 (ref 9–20)
BUN: 29 mg/dL — ABNORMAL HIGH (ref 6–24)
Bilirubin Total: 0.7 mg/dL (ref 0.0–1.2)
CO2: 23 mmol/L (ref 18–29)
Calcium: 9.8 mg/dL (ref 8.7–10.2)
Chloride: 102 mmol/L (ref 96–106)
Creatinine, Ser: 1.99 mg/dL — ABNORMAL HIGH (ref 0.76–1.27)
GFR calc Af Amer: 42 mL/min/1.73 — ABNORMAL LOW
GFR calc non Af Amer: 36 mL/min/1.73 — ABNORMAL LOW
Globulin, Total: 2.6 g/dL (ref 1.5–4.5)
Glucose: 102 mg/dL — ABNORMAL HIGH (ref 65–99)
Potassium: 4.5 mmol/L (ref 3.5–5.2)
Sodium: 142 mmol/L (ref 134–144)
Total Protein: 7.4 g/dL (ref 6.0–8.5)

## 2016-07-12 NOTE — Telephone Encounter (Signed)
Phone call with patient to discuss kidney function.  Refer to nephrology

## 2016-07-23 ENCOUNTER — Other Ambulatory Visit: Payer: Self-pay | Admitting: Unknown Physician Specialty

## 2016-07-24 NOTE — Telephone Encounter (Signed)
Your patient 

## 2016-12-09 DIAGNOSIS — E782 Mixed hyperlipidemia: Secondary | ICD-10-CM | POA: Diagnosis not present

## 2016-12-09 DIAGNOSIS — I639 Cerebral infarction, unspecified: Secondary | ICD-10-CM | POA: Diagnosis not present

## 2016-12-09 DIAGNOSIS — I509 Heart failure, unspecified: Secondary | ICD-10-CM | POA: Diagnosis not present

## 2016-12-09 DIAGNOSIS — R1310 Dysphagia, unspecified: Secondary | ICD-10-CM | POA: Diagnosis not present

## 2016-12-09 DIAGNOSIS — I130 Hypertensive heart and chronic kidney disease with heart failure and stage 1 through stage 4 chronic kidney disease, or unspecified chronic kidney disease: Secondary | ICD-10-CM | POA: Diagnosis not present

## 2016-12-09 DIAGNOSIS — G9341 Metabolic encephalopathy: Secondary | ICD-10-CM | POA: Diagnosis not present

## 2016-12-09 DIAGNOSIS — R569 Unspecified convulsions: Secondary | ICD-10-CM | POA: Diagnosis not present

## 2016-12-09 DIAGNOSIS — R4701 Aphasia: Secondary | ICD-10-CM | POA: Diagnosis not present

## 2016-12-09 DIAGNOSIS — R6521 Severe sepsis with septic shock: Secondary | ICD-10-CM | POA: Diagnosis not present

## 2016-12-09 DIAGNOSIS — Z9911 Dependence on respirator [ventilator] status: Secondary | ICD-10-CM | POA: Diagnosis not present

## 2016-12-09 DIAGNOSIS — I493 Ventricular premature depolarization: Secondary | ICD-10-CM | POA: Diagnosis not present

## 2016-12-09 DIAGNOSIS — N179 Acute kidney failure, unspecified: Secondary | ICD-10-CM | POA: Diagnosis not present

## 2016-12-09 DIAGNOSIS — D6489 Other specified anemias: Secondary | ICD-10-CM | POA: Diagnosis not present

## 2016-12-09 DIAGNOSIS — I472 Ventricular tachycardia: Secondary | ICD-10-CM | POA: Diagnosis not present

## 2016-12-09 DIAGNOSIS — J159 Unspecified bacterial pneumonia: Secondary | ICD-10-CM | POA: Diagnosis not present

## 2016-12-09 DIAGNOSIS — N183 Chronic kidney disease, stage 3 (moderate): Secondary | ICD-10-CM | POA: Diagnosis not present

## 2016-12-09 DIAGNOSIS — G40409 Other generalized epilepsy and epileptic syndromes, not intractable, without status epilepticus: Secondary | ICD-10-CM | POA: Diagnosis not present

## 2016-12-09 DIAGNOSIS — Z7982 Long term (current) use of aspirin: Secondary | ICD-10-CM | POA: Diagnosis not present

## 2016-12-09 DIAGNOSIS — J9601 Acute respiratory failure with hypoxia: Secondary | ICD-10-CM | POA: Diagnosis not present

## 2016-12-09 DIAGNOSIS — G4733 Obstructive sleep apnea (adult) (pediatric): Secondary | ICD-10-CM | POA: Diagnosis not present

## 2016-12-09 DIAGNOSIS — I4891 Unspecified atrial fibrillation: Secondary | ICD-10-CM | POA: Diagnosis not present

## 2016-12-09 DIAGNOSIS — J690 Pneumonitis due to inhalation of food and vomit: Secondary | ICD-10-CM | POA: Diagnosis not present

## 2016-12-09 DIAGNOSIS — R4182 Altered mental status, unspecified: Secondary | ICD-10-CM | POA: Diagnosis not present

## 2016-12-09 DIAGNOSIS — A419 Sepsis, unspecified organism: Secondary | ICD-10-CM | POA: Diagnosis not present

## 2016-12-09 DIAGNOSIS — R2990 Unspecified symptoms and signs involving the nervous system: Secondary | ICD-10-CM | POA: Diagnosis not present

## 2016-12-09 DIAGNOSIS — D696 Thrombocytopenia, unspecified: Secondary | ICD-10-CM | POA: Diagnosis not present

## 2016-12-09 DIAGNOSIS — J9602 Acute respiratory failure with hypercapnia: Secondary | ICD-10-CM | POA: Diagnosis not present

## 2016-12-10 DIAGNOSIS — G9340 Encephalopathy, unspecified: Secondary | ICD-10-CM | POA: Diagnosis not present

## 2016-12-10 DIAGNOSIS — R569 Unspecified convulsions: Secondary | ICD-10-CM | POA: Diagnosis not present

## 2016-12-10 DIAGNOSIS — N189 Chronic kidney disease, unspecified: Secondary | ICD-10-CM | POA: Diagnosis not present

## 2016-12-10 DIAGNOSIS — R4182 Altered mental status, unspecified: Secondary | ICD-10-CM | POA: Diagnosis not present

## 2016-12-10 DIAGNOSIS — I63512 Cerebral infarction due to unspecified occlusion or stenosis of left middle cerebral artery: Secondary | ICD-10-CM | POA: Diagnosis not present

## 2016-12-10 DIAGNOSIS — J9600 Acute respiratory failure, unspecified whether with hypoxia or hypercapnia: Secondary | ICD-10-CM | POA: Diagnosis not present

## 2016-12-11 DIAGNOSIS — G9389 Other specified disorders of brain: Secondary | ICD-10-CM | POA: Diagnosis not present

## 2016-12-11 DIAGNOSIS — R2990 Unspecified symptoms and signs involving the nervous system: Secondary | ICD-10-CM | POA: Diagnosis not present

## 2016-12-11 DIAGNOSIS — J9600 Acute respiratory failure, unspecified whether with hypoxia or hypercapnia: Secondary | ICD-10-CM | POA: Diagnosis not present

## 2016-12-11 DIAGNOSIS — Z4682 Encounter for fitting and adjustment of non-vascular catheter: Secondary | ICD-10-CM | POA: Diagnosis not present

## 2016-12-11 DIAGNOSIS — R569 Unspecified convulsions: Secondary | ICD-10-CM | POA: Diagnosis not present

## 2016-12-11 DIAGNOSIS — R4182 Altered mental status, unspecified: Secondary | ICD-10-CM | POA: Diagnosis not present

## 2016-12-11 DIAGNOSIS — N189 Chronic kidney disease, unspecified: Secondary | ICD-10-CM | POA: Diagnosis not present

## 2016-12-11 DIAGNOSIS — I63512 Cerebral infarction due to unspecified occlusion or stenosis of left middle cerebral artery: Secondary | ICD-10-CM | POA: Diagnosis not present

## 2016-12-12 DIAGNOSIS — I6930 Unspecified sequelae of cerebral infarction: Secondary | ICD-10-CM | POA: Diagnosis not present

## 2016-12-12 DIAGNOSIS — R918 Other nonspecific abnormal finding of lung field: Secondary | ICD-10-CM | POA: Diagnosis not present

## 2016-12-12 DIAGNOSIS — J9601 Acute respiratory failure with hypoxia: Secondary | ICD-10-CM | POA: Diagnosis not present

## 2016-12-12 DIAGNOSIS — Z4682 Encounter for fitting and adjustment of non-vascular catheter: Secondary | ICD-10-CM | POA: Diagnosis not present

## 2016-12-12 DIAGNOSIS — D696 Thrombocytopenia, unspecified: Secondary | ICD-10-CM | POA: Diagnosis not present

## 2016-12-12 DIAGNOSIS — N189 Chronic kidney disease, unspecified: Secondary | ICD-10-CM | POA: Diagnosis not present

## 2016-12-12 DIAGNOSIS — E782 Mixed hyperlipidemia: Secondary | ICD-10-CM | POA: Diagnosis not present

## 2016-12-12 DIAGNOSIS — I63512 Cerebral infarction due to unspecified occlusion or stenosis of left middle cerebral artery: Secondary | ICD-10-CM | POA: Diagnosis not present

## 2016-12-13 DIAGNOSIS — N189 Chronic kidney disease, unspecified: Secondary | ICD-10-CM | POA: Diagnosis not present

## 2016-12-13 DIAGNOSIS — I63512 Cerebral infarction due to unspecified occlusion or stenosis of left middle cerebral artery: Secondary | ICD-10-CM | POA: Diagnosis not present

## 2016-12-13 DIAGNOSIS — E782 Mixed hyperlipidemia: Secondary | ICD-10-CM | POA: Diagnosis not present

## 2016-12-13 DIAGNOSIS — R4182 Altered mental status, unspecified: Secondary | ICD-10-CM | POA: Diagnosis not present

## 2016-12-13 DIAGNOSIS — D696 Thrombocytopenia, unspecified: Secondary | ICD-10-CM | POA: Diagnosis not present

## 2016-12-13 DIAGNOSIS — J9601 Acute respiratory failure with hypoxia: Secondary | ICD-10-CM | POA: Diagnosis not present

## 2016-12-13 DIAGNOSIS — I6930 Unspecified sequelae of cerebral infarction: Secondary | ICD-10-CM | POA: Diagnosis not present

## 2016-12-14 DIAGNOSIS — E782 Mixed hyperlipidemia: Secondary | ICD-10-CM | POA: Diagnosis not present

## 2016-12-14 DIAGNOSIS — I6930 Unspecified sequelae of cerebral infarction: Secondary | ICD-10-CM | POA: Diagnosis not present

## 2016-12-14 DIAGNOSIS — D696 Thrombocytopenia, unspecified: Secondary | ICD-10-CM | POA: Diagnosis not present

## 2016-12-14 DIAGNOSIS — J9600 Acute respiratory failure, unspecified whether with hypoxia or hypercapnia: Secondary | ICD-10-CM | POA: Diagnosis not present

## 2016-12-14 DIAGNOSIS — I63512 Cerebral infarction due to unspecified occlusion or stenosis of left middle cerebral artery: Secondary | ICD-10-CM | POA: Diagnosis not present

## 2016-12-14 DIAGNOSIS — R918 Other nonspecific abnormal finding of lung field: Secondary | ICD-10-CM | POA: Diagnosis not present

## 2016-12-14 DIAGNOSIS — R4182 Altered mental status, unspecified: Secondary | ICD-10-CM | POA: Diagnosis not present

## 2016-12-14 DIAGNOSIS — N189 Chronic kidney disease, unspecified: Secondary | ICD-10-CM | POA: Diagnosis not present

## 2016-12-15 DIAGNOSIS — D696 Thrombocytopenia, unspecified: Secondary | ICD-10-CM | POA: Diagnosis not present

## 2016-12-15 DIAGNOSIS — J9600 Acute respiratory failure, unspecified whether with hypoxia or hypercapnia: Secondary | ICD-10-CM | POA: Diagnosis not present

## 2016-12-15 DIAGNOSIS — I63512 Cerebral infarction due to unspecified occlusion or stenosis of left middle cerebral artery: Secondary | ICD-10-CM | POA: Diagnosis not present

## 2016-12-15 DIAGNOSIS — R4182 Altered mental status, unspecified: Secondary | ICD-10-CM | POA: Diagnosis not present

## 2016-12-15 DIAGNOSIS — N189 Chronic kidney disease, unspecified: Secondary | ICD-10-CM | POA: Diagnosis not present

## 2016-12-15 DIAGNOSIS — E782 Mixed hyperlipidemia: Secondary | ICD-10-CM | POA: Diagnosis not present

## 2016-12-15 DIAGNOSIS — I6930 Unspecified sequelae of cerebral infarction: Secondary | ICD-10-CM | POA: Diagnosis not present

## 2016-12-15 DIAGNOSIS — R918 Other nonspecific abnormal finding of lung field: Secondary | ICD-10-CM | POA: Diagnosis not present

## 2016-12-15 DIAGNOSIS — I69391 Dysphagia following cerebral infarction: Secondary | ICD-10-CM | POA: Diagnosis not present

## 2016-12-15 DIAGNOSIS — J9601 Acute respiratory failure with hypoxia: Secondary | ICD-10-CM | POA: Diagnosis not present

## 2016-12-16 DIAGNOSIS — E782 Mixed hyperlipidemia: Secondary | ICD-10-CM | POA: Diagnosis not present

## 2016-12-16 DIAGNOSIS — R633 Feeding difficulties: Secondary | ICD-10-CM | POA: Diagnosis not present

## 2016-12-16 DIAGNOSIS — I639 Cerebral infarction, unspecified: Secondary | ICD-10-CM | POA: Diagnosis not present

## 2016-12-16 DIAGNOSIS — J9690 Respiratory failure, unspecified, unspecified whether with hypoxia or hypercapnia: Secondary | ICD-10-CM | POA: Diagnosis not present

## 2016-12-16 DIAGNOSIS — R4182 Altered mental status, unspecified: Secondary | ICD-10-CM | POA: Diagnosis not present

## 2016-12-16 DIAGNOSIS — Z9911 Dependence on respirator [ventilator] status: Secondary | ICD-10-CM | POA: Diagnosis not present

## 2016-12-16 DIAGNOSIS — N189 Chronic kidney disease, unspecified: Secondary | ICD-10-CM | POA: Diagnosis not present

## 2016-12-16 DIAGNOSIS — R1310 Dysphagia, unspecified: Secondary | ICD-10-CM | POA: Diagnosis not present

## 2016-12-16 DIAGNOSIS — I63512 Cerebral infarction due to unspecified occlusion or stenosis of left middle cerebral artery: Secondary | ICD-10-CM | POA: Diagnosis not present

## 2016-12-16 DIAGNOSIS — I6930 Unspecified sequelae of cerebral infarction: Secondary | ICD-10-CM | POA: Diagnosis not present

## 2016-12-16 DIAGNOSIS — J9600 Acute respiratory failure, unspecified whether with hypoxia or hypercapnia: Secondary | ICD-10-CM | POA: Diagnosis not present

## 2016-12-16 DIAGNOSIS — R918 Other nonspecific abnormal finding of lung field: Secondary | ICD-10-CM | POA: Diagnosis not present

## 2016-12-16 DIAGNOSIS — D696 Thrombocytopenia, unspecified: Secondary | ICD-10-CM | POA: Diagnosis not present

## 2016-12-17 DIAGNOSIS — I6930 Unspecified sequelae of cerebral infarction: Secondary | ICD-10-CM | POA: Diagnosis not present

## 2016-12-17 DIAGNOSIS — D696 Thrombocytopenia, unspecified: Secondary | ICD-10-CM | POA: Diagnosis not present

## 2016-12-17 DIAGNOSIS — I63512 Cerebral infarction due to unspecified occlusion or stenosis of left middle cerebral artery: Secondary | ICD-10-CM | POA: Diagnosis not present

## 2016-12-17 DIAGNOSIS — R4182 Altered mental status, unspecified: Secondary | ICD-10-CM | POA: Diagnosis not present

## 2016-12-17 DIAGNOSIS — R918 Other nonspecific abnormal finding of lung field: Secondary | ICD-10-CM | POA: Diagnosis not present

## 2016-12-17 DIAGNOSIS — J9600 Acute respiratory failure, unspecified whether with hypoxia or hypercapnia: Secondary | ICD-10-CM | POA: Diagnosis not present

## 2016-12-17 DIAGNOSIS — Z4682 Encounter for fitting and adjustment of non-vascular catheter: Secondary | ICD-10-CM | POA: Diagnosis not present

## 2016-12-17 DIAGNOSIS — N189 Chronic kidney disease, unspecified: Secondary | ICD-10-CM | POA: Diagnosis not present

## 2016-12-17 DIAGNOSIS — J9602 Acute respiratory failure with hypercapnia: Secondary | ICD-10-CM | POA: Diagnosis not present

## 2016-12-17 DIAGNOSIS — E782 Mixed hyperlipidemia: Secondary | ICD-10-CM | POA: Diagnosis not present

## 2016-12-17 DIAGNOSIS — J9601 Acute respiratory failure with hypoxia: Secondary | ICD-10-CM | POA: Diagnosis not present

## 2016-12-18 DIAGNOSIS — R918 Other nonspecific abnormal finding of lung field: Secondary | ICD-10-CM | POA: Diagnosis not present

## 2016-12-18 DIAGNOSIS — J9602 Acute respiratory failure with hypercapnia: Secondary | ICD-10-CM | POA: Diagnosis not present

## 2016-12-18 DIAGNOSIS — R6510 Systemic inflammatory response syndrome (SIRS) of non-infectious origin without acute organ dysfunction: Secondary | ICD-10-CM | POA: Diagnosis not present

## 2016-12-18 DIAGNOSIS — J9601 Acute respiratory failure with hypoxia: Secondary | ICD-10-CM | POA: Diagnosis not present

## 2016-12-18 DIAGNOSIS — R569 Unspecified convulsions: Secondary | ICD-10-CM | POA: Diagnosis not present

## 2016-12-18 DIAGNOSIS — I639 Cerebral infarction, unspecified: Secondary | ICD-10-CM | POA: Diagnosis not present

## 2016-12-18 DIAGNOSIS — I63512 Cerebral infarction due to unspecified occlusion or stenosis of left middle cerebral artery: Secondary | ICD-10-CM | POA: Diagnosis not present

## 2016-12-18 DIAGNOSIS — G9340 Encephalopathy, unspecified: Secondary | ICD-10-CM | POA: Diagnosis not present

## 2016-12-18 DIAGNOSIS — J9600 Acute respiratory failure, unspecified whether with hypoxia or hypercapnia: Secondary | ICD-10-CM | POA: Diagnosis not present

## 2016-12-18 DIAGNOSIS — I82621 Acute embolism and thrombosis of deep veins of right upper extremity: Secondary | ICD-10-CM | POA: Diagnosis not present

## 2016-12-18 DIAGNOSIS — J90 Pleural effusion, not elsewhere classified: Secondary | ICD-10-CM | POA: Diagnosis not present

## 2016-12-18 DIAGNOSIS — J159 Unspecified bacterial pneumonia: Secondary | ICD-10-CM | POA: Diagnosis not present

## 2016-12-18 DIAGNOSIS — I472 Ventricular tachycardia: Secondary | ICD-10-CM | POA: Diagnosis not present

## 2016-12-19 DIAGNOSIS — I82611 Acute embolism and thrombosis of superficial veins of right upper extremity: Secondary | ICD-10-CM | POA: Diagnosis not present

## 2016-12-19 DIAGNOSIS — G9340 Encephalopathy, unspecified: Secondary | ICD-10-CM | POA: Diagnosis not present

## 2016-12-19 DIAGNOSIS — R6510 Systemic inflammatory response syndrome (SIRS) of non-infectious origin without acute organ dysfunction: Secondary | ICD-10-CM | POA: Diagnosis not present

## 2016-12-19 DIAGNOSIS — I82B11 Acute embolism and thrombosis of right subclavian vein: Secondary | ICD-10-CM | POA: Diagnosis not present

## 2016-12-19 DIAGNOSIS — J90 Pleural effusion, not elsewhere classified: Secondary | ICD-10-CM | POA: Diagnosis not present

## 2016-12-19 DIAGNOSIS — J9602 Acute respiratory failure with hypercapnia: Secondary | ICD-10-CM | POA: Diagnosis not present

## 2016-12-19 DIAGNOSIS — I472 Ventricular tachycardia: Secondary | ICD-10-CM | POA: Diagnosis not present

## 2016-12-19 DIAGNOSIS — I82A11 Acute embolism and thrombosis of right axillary vein: Secondary | ICD-10-CM | POA: Diagnosis not present

## 2016-12-19 DIAGNOSIS — J159 Unspecified bacterial pneumonia: Secondary | ICD-10-CM | POA: Diagnosis not present

## 2016-12-19 DIAGNOSIS — I63512 Cerebral infarction due to unspecified occlusion or stenosis of left middle cerebral artery: Secondary | ICD-10-CM | POA: Diagnosis not present

## 2016-12-19 DIAGNOSIS — I959 Hypotension, unspecified: Secondary | ICD-10-CM | POA: Diagnosis not present

## 2016-12-19 DIAGNOSIS — J9601 Acute respiratory failure with hypoxia: Secondary | ICD-10-CM | POA: Diagnosis not present

## 2016-12-19 DIAGNOSIS — M7989 Other specified soft tissue disorders: Secondary | ICD-10-CM | POA: Diagnosis not present

## 2016-12-19 DIAGNOSIS — R918 Other nonspecific abnormal finding of lung field: Secondary | ICD-10-CM | POA: Diagnosis not present

## 2016-12-19 DIAGNOSIS — I82621 Acute embolism and thrombosis of deep veins of right upper extremity: Secondary | ICD-10-CM | POA: Diagnosis not present

## 2016-12-19 DIAGNOSIS — R569 Unspecified convulsions: Secondary | ICD-10-CM | POA: Diagnosis not present

## 2016-12-20 DIAGNOSIS — R6510 Systemic inflammatory response syndrome (SIRS) of non-infectious origin without acute organ dysfunction: Secondary | ICD-10-CM | POA: Diagnosis not present

## 2016-12-20 DIAGNOSIS — G9340 Encephalopathy, unspecified: Secondary | ICD-10-CM | POA: Diagnosis not present

## 2016-12-20 DIAGNOSIS — I82621 Acute embolism and thrombosis of deep veins of right upper extremity: Secondary | ICD-10-CM | POA: Diagnosis not present

## 2016-12-20 DIAGNOSIS — J9602 Acute respiratory failure with hypercapnia: Secondary | ICD-10-CM | POA: Diagnosis not present

## 2016-12-20 DIAGNOSIS — I63512 Cerebral infarction due to unspecified occlusion or stenosis of left middle cerebral artery: Secondary | ICD-10-CM | POA: Diagnosis not present

## 2016-12-20 DIAGNOSIS — J159 Unspecified bacterial pneumonia: Secondary | ICD-10-CM | POA: Diagnosis not present

## 2016-12-20 DIAGNOSIS — J9600 Acute respiratory failure, unspecified whether with hypoxia or hypercapnia: Secondary | ICD-10-CM | POA: Diagnosis not present

## 2016-12-20 DIAGNOSIS — R569 Unspecified convulsions: Secondary | ICD-10-CM | POA: Diagnosis not present

## 2016-12-20 DIAGNOSIS — I472 Ventricular tachycardia: Secondary | ICD-10-CM | POA: Diagnosis not present

## 2016-12-20 DIAGNOSIS — R918 Other nonspecific abnormal finding of lung field: Secondary | ICD-10-CM | POA: Diagnosis not present

## 2016-12-20 DIAGNOSIS — J9601 Acute respiratory failure with hypoxia: Secondary | ICD-10-CM | POA: Diagnosis not present

## 2016-12-21 DIAGNOSIS — J159 Unspecified bacterial pneumonia: Secondary | ICD-10-CM | POA: Diagnosis not present

## 2016-12-21 DIAGNOSIS — J9602 Acute respiratory failure with hypercapnia: Secondary | ICD-10-CM | POA: Diagnosis not present

## 2016-12-21 DIAGNOSIS — R6510 Systemic inflammatory response syndrome (SIRS) of non-infectious origin without acute organ dysfunction: Secondary | ICD-10-CM | POA: Diagnosis not present

## 2016-12-21 DIAGNOSIS — I82621 Acute embolism and thrombosis of deep veins of right upper extremity: Secondary | ICD-10-CM | POA: Diagnosis not present

## 2016-12-21 DIAGNOSIS — R569 Unspecified convulsions: Secondary | ICD-10-CM | POA: Diagnosis not present

## 2016-12-21 DIAGNOSIS — I472 Ventricular tachycardia: Secondary | ICD-10-CM | POA: Diagnosis not present

## 2016-12-21 DIAGNOSIS — J9600 Acute respiratory failure, unspecified whether with hypoxia or hypercapnia: Secondary | ICD-10-CM | POA: Diagnosis not present

## 2016-12-21 DIAGNOSIS — J9601 Acute respiratory failure with hypoxia: Secondary | ICD-10-CM | POA: Diagnosis not present

## 2016-12-21 DIAGNOSIS — I63512 Cerebral infarction due to unspecified occlusion or stenosis of left middle cerebral artery: Secondary | ICD-10-CM | POA: Diagnosis not present

## 2016-12-21 DIAGNOSIS — G9340 Encephalopathy, unspecified: Secondary | ICD-10-CM | POA: Diagnosis not present

## 2016-12-22 DIAGNOSIS — J9601 Acute respiratory failure with hypoxia: Secondary | ICD-10-CM | POA: Diagnosis not present

## 2016-12-22 DIAGNOSIS — I82621 Acute embolism and thrombosis of deep veins of right upper extremity: Secondary | ICD-10-CM | POA: Diagnosis not present

## 2016-12-22 DIAGNOSIS — R569 Unspecified convulsions: Secondary | ICD-10-CM | POA: Diagnosis not present

## 2016-12-22 DIAGNOSIS — J159 Unspecified bacterial pneumonia: Secondary | ICD-10-CM | POA: Diagnosis not present

## 2016-12-22 DIAGNOSIS — R6510 Systemic inflammatory response syndrome (SIRS) of non-infectious origin without acute organ dysfunction: Secondary | ICD-10-CM | POA: Diagnosis not present

## 2016-12-22 DIAGNOSIS — I63512 Cerebral infarction due to unspecified occlusion or stenosis of left middle cerebral artery: Secondary | ICD-10-CM | POA: Diagnosis not present

## 2016-12-22 DIAGNOSIS — J9600 Acute respiratory failure, unspecified whether with hypoxia or hypercapnia: Secondary | ICD-10-CM | POA: Diagnosis not present

## 2016-12-22 DIAGNOSIS — I472 Ventricular tachycardia: Secondary | ICD-10-CM | POA: Diagnosis not present

## 2016-12-22 DIAGNOSIS — R4182 Altered mental status, unspecified: Secondary | ICD-10-CM | POA: Diagnosis not present

## 2016-12-22 DIAGNOSIS — N189 Chronic kidney disease, unspecified: Secondary | ICD-10-CM | POA: Diagnosis not present

## 2016-12-22 DIAGNOSIS — J9602 Acute respiratory failure with hypercapnia: Secondary | ICD-10-CM | POA: Diagnosis not present

## 2016-12-22 DIAGNOSIS — G9340 Encephalopathy, unspecified: Secondary | ICD-10-CM | POA: Diagnosis not present

## 2016-12-22 DIAGNOSIS — R918 Other nonspecific abnormal finding of lung field: Secondary | ICD-10-CM | POA: Diagnosis not present

## 2016-12-23 DIAGNOSIS — I82621 Acute embolism and thrombosis of deep veins of right upper extremity: Secondary | ICD-10-CM | POA: Diagnosis not present

## 2016-12-23 DIAGNOSIS — J9602 Acute respiratory failure with hypercapnia: Secondary | ICD-10-CM | POA: Diagnosis not present

## 2016-12-23 DIAGNOSIS — R6510 Systemic inflammatory response syndrome (SIRS) of non-infectious origin without acute organ dysfunction: Secondary | ICD-10-CM | POA: Diagnosis not present

## 2016-12-23 DIAGNOSIS — J159 Unspecified bacterial pneumonia: Secondary | ICD-10-CM | POA: Diagnosis not present

## 2016-12-23 DIAGNOSIS — I472 Ventricular tachycardia: Secondary | ICD-10-CM | POA: Diagnosis not present

## 2016-12-23 DIAGNOSIS — G9340 Encephalopathy, unspecified: Secondary | ICD-10-CM | POA: Diagnosis not present

## 2016-12-23 DIAGNOSIS — J9601 Acute respiratory failure with hypoxia: Secondary | ICD-10-CM | POA: Diagnosis not present

## 2016-12-23 DIAGNOSIS — R569 Unspecified convulsions: Secondary | ICD-10-CM | POA: Diagnosis not present

## 2016-12-23 DIAGNOSIS — I63512 Cerebral infarction due to unspecified occlusion or stenosis of left middle cerebral artery: Secondary | ICD-10-CM | POA: Diagnosis not present

## 2016-12-24 DIAGNOSIS — R569 Unspecified convulsions: Secondary | ICD-10-CM | POA: Diagnosis not present

## 2016-12-24 DIAGNOSIS — I472 Ventricular tachycardia: Secondary | ICD-10-CM | POA: Diagnosis not present

## 2016-12-24 DIAGNOSIS — J159 Unspecified bacterial pneumonia: Secondary | ICD-10-CM | POA: Diagnosis not present

## 2016-12-24 DIAGNOSIS — G9340 Encephalopathy, unspecified: Secondary | ICD-10-CM | POA: Diagnosis not present

## 2016-12-24 DIAGNOSIS — J9602 Acute respiratory failure with hypercapnia: Secondary | ICD-10-CM | POA: Diagnosis not present

## 2016-12-24 DIAGNOSIS — I82621 Acute embolism and thrombosis of deep veins of right upper extremity: Secondary | ICD-10-CM | POA: Diagnosis not present

## 2016-12-24 DIAGNOSIS — R6510 Systemic inflammatory response syndrome (SIRS) of non-infectious origin without acute organ dysfunction: Secondary | ICD-10-CM | POA: Diagnosis not present

## 2016-12-24 DIAGNOSIS — J9601 Acute respiratory failure with hypoxia: Secondary | ICD-10-CM | POA: Diagnosis not present

## 2016-12-24 DIAGNOSIS — I63512 Cerebral infarction due to unspecified occlusion or stenosis of left middle cerebral artery: Secondary | ICD-10-CM | POA: Diagnosis not present

## 2016-12-25 DIAGNOSIS — N189 Chronic kidney disease, unspecified: Secondary | ICD-10-CM | POA: Diagnosis not present

## 2016-12-25 DIAGNOSIS — I63412 Cerebral infarction due to embolism of left middle cerebral artery: Secondary | ICD-10-CM | POA: Diagnosis not present

## 2016-12-25 DIAGNOSIS — I447 Left bundle-branch block, unspecified: Secondary | ICD-10-CM | POA: Diagnosis not present

## 2016-12-25 DIAGNOSIS — Z6830 Body mass index (BMI) 30.0-30.9, adult: Secondary | ICD-10-CM | POA: Diagnosis not present

## 2016-12-25 DIAGNOSIS — J9600 Acute respiratory failure, unspecified whether with hypoxia or hypercapnia: Secondary | ICD-10-CM | POA: Diagnosis not present

## 2016-12-25 DIAGNOSIS — E46 Unspecified protein-calorie malnutrition: Secondary | ICD-10-CM | POA: Diagnosis not present

## 2016-12-25 DIAGNOSIS — R4182 Altered mental status, unspecified: Secondary | ICD-10-CM | POA: Diagnosis not present

## 2016-12-25 DIAGNOSIS — R4020 Unspecified coma: Secondary | ICD-10-CM | POA: Diagnosis not present

## 2016-12-25 DIAGNOSIS — Z930 Tracheostomy status: Secondary | ICD-10-CM | POA: Diagnosis not present

## 2016-12-25 DIAGNOSIS — G40909 Epilepsy, unspecified, not intractable, without status epilepticus: Secondary | ICD-10-CM | POA: Diagnosis not present

## 2016-12-25 DIAGNOSIS — R569 Unspecified convulsions: Secondary | ICD-10-CM | POA: Diagnosis not present

## 2016-12-25 DIAGNOSIS — I639 Cerebral infarction, unspecified: Secondary | ICD-10-CM | POA: Diagnosis not present

## 2016-12-25 DIAGNOSIS — Z431 Encounter for attention to gastrostomy: Secondary | ICD-10-CM | POA: Diagnosis not present

## 2016-12-25 DIAGNOSIS — I959 Hypotension, unspecified: Secondary | ICD-10-CM | POA: Diagnosis not present

## 2016-12-25 DIAGNOSIS — E782 Mixed hyperlipidemia: Secondary | ICD-10-CM | POA: Diagnosis not present

## 2016-12-25 DIAGNOSIS — D72829 Elevated white blood cell count, unspecified: Secondary | ICD-10-CM | POA: Diagnosis not present

## 2016-12-25 DIAGNOSIS — I63512 Cerebral infarction due to unspecified occlusion or stenosis of left middle cerebral artery: Secondary | ICD-10-CM | POA: Diagnosis not present

## 2016-12-25 DIAGNOSIS — R262 Difficulty in walking, not elsewhere classified: Secondary | ICD-10-CM | POA: Diagnosis not present

## 2016-12-25 DIAGNOSIS — G9389 Other specified disorders of brain: Secondary | ICD-10-CM | POA: Diagnosis not present

## 2016-12-25 DIAGNOSIS — R1310 Dysphagia, unspecified: Secondary | ICD-10-CM | POA: Diagnosis not present

## 2016-12-25 DIAGNOSIS — J9622 Acute and chronic respiratory failure with hypercapnia: Secondary | ICD-10-CM | POA: Diagnosis not present

## 2016-12-25 DIAGNOSIS — I472 Ventricular tachycardia: Secondary | ICD-10-CM | POA: Diagnosis not present

## 2016-12-25 DIAGNOSIS — Z5189 Encounter for other specified aftercare: Secondary | ICD-10-CM | POA: Diagnosis not present

## 2016-12-25 DIAGNOSIS — I4891 Unspecified atrial fibrillation: Secondary | ICD-10-CM | POA: Diagnosis not present

## 2016-12-25 DIAGNOSIS — J9601 Acute respiratory failure with hypoxia: Secondary | ICD-10-CM | POA: Diagnosis not present

## 2016-12-25 DIAGNOSIS — J9602 Acute respiratory failure with hypercapnia: Secondary | ICD-10-CM | POA: Diagnosis not present

## 2016-12-25 DIAGNOSIS — G9340 Encephalopathy, unspecified: Secondary | ICD-10-CM | POA: Diagnosis not present

## 2016-12-25 DIAGNOSIS — N179 Acute kidney failure, unspecified: Secondary | ICD-10-CM | POA: Diagnosis not present

## 2016-12-25 DIAGNOSIS — I69398 Other sequelae of cerebral infarction: Secondary | ICD-10-CM | POA: Diagnosis not present

## 2016-12-25 DIAGNOSIS — I130 Hypertensive heart and chronic kidney disease with heart failure and stage 1 through stage 4 chronic kidney disease, or unspecified chronic kidney disease: Secondary | ICD-10-CM | POA: Diagnosis not present

## 2016-12-25 DIAGNOSIS — I69354 Hemiplegia and hemiparesis following cerebral infarction affecting left non-dominant side: Secondary | ICD-10-CM | POA: Diagnosis not present

## 2016-12-25 DIAGNOSIS — J9621 Acute and chronic respiratory failure with hypoxia: Secondary | ICD-10-CM | POA: Diagnosis not present

## 2016-12-25 DIAGNOSIS — Z430 Encounter for attention to tracheostomy: Secondary | ICD-10-CM | POA: Diagnosis not present

## 2016-12-25 DIAGNOSIS — I82621 Acute embolism and thrombosis of deep veins of right upper extremity: Secondary | ICD-10-CM | POA: Diagnosis not present

## 2016-12-25 DIAGNOSIS — Z7401 Bed confinement status: Secondary | ICD-10-CM | POA: Diagnosis not present

## 2016-12-25 DIAGNOSIS — I5022 Chronic systolic (congestive) heart failure: Secondary | ICD-10-CM | POA: Diagnosis not present

## 2016-12-25 DIAGNOSIS — I6930 Unspecified sequelae of cerebral infarction: Secondary | ICD-10-CM | POA: Diagnosis not present

## 2016-12-25 DIAGNOSIS — A419 Sepsis, unspecified organism: Secondary | ICD-10-CM | POA: Diagnosis not present

## 2016-12-25 DIAGNOSIS — R21 Rash and other nonspecific skin eruption: Secondary | ICD-10-CM | POA: Diagnosis not present

## 2016-12-25 DIAGNOSIS — I340 Nonrheumatic mitral (valve) insufficiency: Secondary | ICD-10-CM | POA: Diagnosis not present

## 2016-12-26 DIAGNOSIS — J9601 Acute respiratory failure with hypoxia: Secondary | ICD-10-CM | POA: Diagnosis not present

## 2016-12-26 DIAGNOSIS — I63412 Cerebral infarction due to embolism of left middle cerebral artery: Secondary | ICD-10-CM | POA: Diagnosis not present

## 2016-12-26 DIAGNOSIS — J9602 Acute respiratory failure with hypercapnia: Secondary | ICD-10-CM | POA: Diagnosis not present

## 2016-12-26 DIAGNOSIS — I69354 Hemiplegia and hemiparesis following cerebral infarction affecting left non-dominant side: Secondary | ICD-10-CM | POA: Diagnosis not present

## 2016-12-26 DIAGNOSIS — J9621 Acute and chronic respiratory failure with hypoxia: Secondary | ICD-10-CM | POA: Diagnosis not present

## 2016-12-26 DIAGNOSIS — J9690 Respiratory failure, unspecified, unspecified whether with hypoxia or hypercapnia: Secondary | ICD-10-CM | POA: Diagnosis not present

## 2016-12-26 DIAGNOSIS — I6930 Unspecified sequelae of cerebral infarction: Secondary | ICD-10-CM | POA: Diagnosis not present

## 2016-12-27 DIAGNOSIS — J9601 Acute respiratory failure with hypoxia: Secondary | ICD-10-CM | POA: Diagnosis not present

## 2016-12-27 DIAGNOSIS — I69354 Hemiplegia and hemiparesis following cerebral infarction affecting left non-dominant side: Secondary | ICD-10-CM | POA: Diagnosis not present

## 2016-12-27 DIAGNOSIS — I63412 Cerebral infarction due to embolism of left middle cerebral artery: Secondary | ICD-10-CM | POA: Diagnosis not present

## 2016-12-27 DIAGNOSIS — J9602 Acute respiratory failure with hypercapnia: Secondary | ICD-10-CM | POA: Diagnosis not present

## 2016-12-28 DIAGNOSIS — I6930 Unspecified sequelae of cerebral infarction: Secondary | ICD-10-CM | POA: Diagnosis not present

## 2016-12-28 DIAGNOSIS — I69354 Hemiplegia and hemiparesis following cerebral infarction affecting left non-dominant side: Secondary | ICD-10-CM | POA: Diagnosis not present

## 2016-12-28 DIAGNOSIS — J9601 Acute respiratory failure with hypoxia: Secondary | ICD-10-CM | POA: Diagnosis not present

## 2016-12-28 DIAGNOSIS — J9602 Acute respiratory failure with hypercapnia: Secondary | ICD-10-CM | POA: Diagnosis not present

## 2016-12-28 DIAGNOSIS — I63412 Cerebral infarction due to embolism of left middle cerebral artery: Secondary | ICD-10-CM | POA: Diagnosis not present

## 2016-12-28 DIAGNOSIS — J9621 Acute and chronic respiratory failure with hypoxia: Secondary | ICD-10-CM | POA: Diagnosis not present

## 2016-12-29 DIAGNOSIS — I63412 Cerebral infarction due to embolism of left middle cerebral artery: Secondary | ICD-10-CM | POA: Diagnosis not present

## 2016-12-29 DIAGNOSIS — J9601 Acute respiratory failure with hypoxia: Secondary | ICD-10-CM | POA: Diagnosis not present

## 2016-12-29 DIAGNOSIS — I69354 Hemiplegia and hemiparesis following cerebral infarction affecting left non-dominant side: Secondary | ICD-10-CM | POA: Diagnosis not present

## 2016-12-29 DIAGNOSIS — J9602 Acute respiratory failure with hypercapnia: Secondary | ICD-10-CM | POA: Diagnosis not present

## 2016-12-29 DIAGNOSIS — I6930 Unspecified sequelae of cerebral infarction: Secondary | ICD-10-CM | POA: Diagnosis not present

## 2016-12-29 DIAGNOSIS — J9621 Acute and chronic respiratory failure with hypoxia: Secondary | ICD-10-CM | POA: Diagnosis not present

## 2016-12-30 DIAGNOSIS — J9601 Acute respiratory failure with hypoxia: Secondary | ICD-10-CM | POA: Diagnosis not present

## 2016-12-30 DIAGNOSIS — I69354 Hemiplegia and hemiparesis following cerebral infarction affecting left non-dominant side: Secondary | ICD-10-CM | POA: Diagnosis not present

## 2016-12-30 DIAGNOSIS — I63412 Cerebral infarction due to embolism of left middle cerebral artery: Secondary | ICD-10-CM | POA: Diagnosis not present

## 2016-12-30 DIAGNOSIS — J9602 Acute respiratory failure with hypercapnia: Secondary | ICD-10-CM | POA: Diagnosis not present

## 2016-12-31 DIAGNOSIS — I63412 Cerebral infarction due to embolism of left middle cerebral artery: Secondary | ICD-10-CM | POA: Diagnosis not present

## 2016-12-31 DIAGNOSIS — J9601 Acute respiratory failure with hypoxia: Secondary | ICD-10-CM | POA: Diagnosis not present

## 2016-12-31 DIAGNOSIS — I69354 Hemiplegia and hemiparesis following cerebral infarction affecting left non-dominant side: Secondary | ICD-10-CM | POA: Diagnosis not present

## 2016-12-31 DIAGNOSIS — J9602 Acute respiratory failure with hypercapnia: Secondary | ICD-10-CM | POA: Diagnosis not present

## 2017-01-02 DIAGNOSIS — I63412 Cerebral infarction due to embolism of left middle cerebral artery: Secondary | ICD-10-CM | POA: Diagnosis not present

## 2017-01-02 DIAGNOSIS — I69354 Hemiplegia and hemiparesis following cerebral infarction affecting left non-dominant side: Secondary | ICD-10-CM | POA: Diagnosis not present

## 2017-01-02 DIAGNOSIS — A419 Sepsis, unspecified organism: Secondary | ICD-10-CM | POA: Diagnosis not present

## 2017-01-02 DIAGNOSIS — R6521 Severe sepsis with septic shock: Secondary | ICD-10-CM | POA: Diagnosis not present

## 2017-01-03 DIAGNOSIS — I69354 Hemiplegia and hemiparesis following cerebral infarction affecting left non-dominant side: Secondary | ICD-10-CM | POA: Diagnosis not present

## 2017-01-03 DIAGNOSIS — J9621 Acute and chronic respiratory failure with hypoxia: Secondary | ICD-10-CM | POA: Diagnosis not present

## 2017-01-03 DIAGNOSIS — I6930 Unspecified sequelae of cerebral infarction: Secondary | ICD-10-CM | POA: Diagnosis not present

## 2017-01-03 DIAGNOSIS — I63412 Cerebral infarction due to embolism of left middle cerebral artery: Secondary | ICD-10-CM | POA: Diagnosis not present

## 2017-01-03 DIAGNOSIS — R6521 Severe sepsis with septic shock: Secondary | ICD-10-CM | POA: Diagnosis not present

## 2017-01-03 DIAGNOSIS — A419 Sepsis, unspecified organism: Secondary | ICD-10-CM | POA: Diagnosis not present

## 2017-01-04 DIAGNOSIS — I69354 Hemiplegia and hemiparesis following cerebral infarction affecting left non-dominant side: Secondary | ICD-10-CM | POA: Diagnosis not present

## 2017-01-04 DIAGNOSIS — J9602 Acute respiratory failure with hypercapnia: Secondary | ICD-10-CM | POA: Diagnosis not present

## 2017-01-04 DIAGNOSIS — J9601 Acute respiratory failure with hypoxia: Secondary | ICD-10-CM | POA: Diagnosis not present

## 2017-01-04 DIAGNOSIS — I63412 Cerebral infarction due to embolism of left middle cerebral artery: Secondary | ICD-10-CM | POA: Diagnosis not present

## 2017-01-05 DIAGNOSIS — R6521 Severe sepsis with septic shock: Secondary | ICD-10-CM | POA: Diagnosis not present

## 2017-01-05 DIAGNOSIS — A419 Sepsis, unspecified organism: Secondary | ICD-10-CM | POA: Diagnosis not present

## 2017-01-05 DIAGNOSIS — I69354 Hemiplegia and hemiparesis following cerebral infarction affecting left non-dominant side: Secondary | ICD-10-CM | POA: Diagnosis not present

## 2017-01-05 DIAGNOSIS — I63412 Cerebral infarction due to embolism of left middle cerebral artery: Secondary | ICD-10-CM | POA: Diagnosis not present

## 2017-01-06 DIAGNOSIS — I69354 Hemiplegia and hemiparesis following cerebral infarction affecting left non-dominant side: Secondary | ICD-10-CM | POA: Diagnosis not present

## 2017-01-06 DIAGNOSIS — A419 Sepsis, unspecified organism: Secondary | ICD-10-CM | POA: Diagnosis not present

## 2017-01-06 DIAGNOSIS — R6521 Severe sepsis with septic shock: Secondary | ICD-10-CM | POA: Diagnosis not present

## 2017-01-06 DIAGNOSIS — I63412 Cerebral infarction due to embolism of left middle cerebral artery: Secondary | ICD-10-CM | POA: Diagnosis not present

## 2017-01-06 DIAGNOSIS — I6930 Unspecified sequelae of cerebral infarction: Secondary | ICD-10-CM | POA: Diagnosis not present

## 2017-01-06 DIAGNOSIS — J9621 Acute and chronic respiratory failure with hypoxia: Secondary | ICD-10-CM | POA: Diagnosis not present

## 2017-01-07 DIAGNOSIS — I63412 Cerebral infarction due to embolism of left middle cerebral artery: Secondary | ICD-10-CM | POA: Diagnosis not present

## 2017-01-07 DIAGNOSIS — R6521 Severe sepsis with septic shock: Secondary | ICD-10-CM | POA: Diagnosis not present

## 2017-01-07 DIAGNOSIS — I69354 Hemiplegia and hemiparesis following cerebral infarction affecting left non-dominant side: Secondary | ICD-10-CM | POA: Diagnosis not present

## 2017-01-07 DIAGNOSIS — A419 Sepsis, unspecified organism: Secondary | ICD-10-CM | POA: Diagnosis not present

## 2017-01-08 ENCOUNTER — Encounter: Payer: PPO | Admitting: Unknown Physician Specialty

## 2017-01-08 DIAGNOSIS — I63412 Cerebral infarction due to embolism of left middle cerebral artery: Secondary | ICD-10-CM | POA: Diagnosis not present

## 2017-01-08 DIAGNOSIS — R6521 Severe sepsis with septic shock: Secondary | ICD-10-CM | POA: Diagnosis not present

## 2017-01-08 DIAGNOSIS — A419 Sepsis, unspecified organism: Secondary | ICD-10-CM | POA: Diagnosis not present

## 2017-01-08 DIAGNOSIS — J9621 Acute and chronic respiratory failure with hypoxia: Secondary | ICD-10-CM | POA: Diagnosis not present

## 2017-01-08 DIAGNOSIS — I6930 Unspecified sequelae of cerebral infarction: Secondary | ICD-10-CM | POA: Diagnosis not present

## 2017-01-08 DIAGNOSIS — I69354 Hemiplegia and hemiparesis following cerebral infarction affecting left non-dominant side: Secondary | ICD-10-CM | POA: Diagnosis not present

## 2017-01-09 DIAGNOSIS — I63412 Cerebral infarction due to embolism of left middle cerebral artery: Secondary | ICD-10-CM | POA: Diagnosis not present

## 2017-01-09 DIAGNOSIS — I69354 Hemiplegia and hemiparesis following cerebral infarction affecting left non-dominant side: Secondary | ICD-10-CM | POA: Diagnosis not present

## 2017-01-09 DIAGNOSIS — R6521 Severe sepsis with septic shock: Secondary | ICD-10-CM | POA: Diagnosis not present

## 2017-01-09 DIAGNOSIS — A419 Sepsis, unspecified organism: Secondary | ICD-10-CM | POA: Diagnosis not present

## 2017-01-10 DIAGNOSIS — J9621 Acute and chronic respiratory failure with hypoxia: Secondary | ICD-10-CM | POA: Diagnosis not present

## 2017-01-10 DIAGNOSIS — I6930 Unspecified sequelae of cerebral infarction: Secondary | ICD-10-CM | POA: Diagnosis not present

## 2017-01-10 DIAGNOSIS — A419 Sepsis, unspecified organism: Secondary | ICD-10-CM | POA: Diagnosis not present

## 2017-01-10 DIAGNOSIS — R6521 Severe sepsis with septic shock: Secondary | ICD-10-CM | POA: Diagnosis not present

## 2017-01-10 DIAGNOSIS — I69354 Hemiplegia and hemiparesis following cerebral infarction affecting left non-dominant side: Secondary | ICD-10-CM | POA: Diagnosis not present

## 2017-01-10 DIAGNOSIS — I63412 Cerebral infarction due to embolism of left middle cerebral artery: Secondary | ICD-10-CM | POA: Diagnosis not present

## 2017-01-11 DIAGNOSIS — I63412 Cerebral infarction due to embolism of left middle cerebral artery: Secondary | ICD-10-CM | POA: Diagnosis not present

## 2017-01-11 DIAGNOSIS — R6521 Severe sepsis with septic shock: Secondary | ICD-10-CM | POA: Diagnosis not present

## 2017-01-11 DIAGNOSIS — A419 Sepsis, unspecified organism: Secondary | ICD-10-CM | POA: Diagnosis not present

## 2017-01-11 DIAGNOSIS — I69354 Hemiplegia and hemiparesis following cerebral infarction affecting left non-dominant side: Secondary | ICD-10-CM | POA: Diagnosis not present

## 2017-01-12 DIAGNOSIS — I63412 Cerebral infarction due to embolism of left middle cerebral artery: Secondary | ICD-10-CM | POA: Diagnosis not present

## 2017-01-12 DIAGNOSIS — R6521 Severe sepsis with septic shock: Secondary | ICD-10-CM | POA: Diagnosis not present

## 2017-01-12 DIAGNOSIS — I69354 Hemiplegia and hemiparesis following cerebral infarction affecting left non-dominant side: Secondary | ICD-10-CM | POA: Diagnosis not present

## 2017-01-12 DIAGNOSIS — A419 Sepsis, unspecified organism: Secondary | ICD-10-CM | POA: Diagnosis not present

## 2017-01-13 DIAGNOSIS — I69354 Hemiplegia and hemiparesis following cerebral infarction affecting left non-dominant side: Secondary | ICD-10-CM | POA: Diagnosis not present

## 2017-01-13 DIAGNOSIS — A419 Sepsis, unspecified organism: Secondary | ICD-10-CM | POA: Diagnosis not present

## 2017-01-13 DIAGNOSIS — I63412 Cerebral infarction due to embolism of left middle cerebral artery: Secondary | ICD-10-CM | POA: Diagnosis not present

## 2017-01-13 DIAGNOSIS — R6521 Severe sepsis with septic shock: Secondary | ICD-10-CM | POA: Diagnosis not present

## 2017-01-14 DIAGNOSIS — I69354 Hemiplegia and hemiparesis following cerebral infarction affecting left non-dominant side: Secondary | ICD-10-CM | POA: Diagnosis not present

## 2017-01-14 DIAGNOSIS — A419 Sepsis, unspecified organism: Secondary | ICD-10-CM | POA: Diagnosis not present

## 2017-01-14 DIAGNOSIS — R6521 Severe sepsis with septic shock: Secondary | ICD-10-CM | POA: Diagnosis not present

## 2017-01-14 DIAGNOSIS — I63412 Cerebral infarction due to embolism of left middle cerebral artery: Secondary | ICD-10-CM | POA: Diagnosis not present

## 2017-01-15 DIAGNOSIS — I69354 Hemiplegia and hemiparesis following cerebral infarction affecting left non-dominant side: Secondary | ICD-10-CM | POA: Diagnosis not present

## 2017-01-15 DIAGNOSIS — R6521 Severe sepsis with septic shock: Secondary | ICD-10-CM | POA: Diagnosis not present

## 2017-01-15 DIAGNOSIS — A419 Sepsis, unspecified organism: Secondary | ICD-10-CM | POA: Diagnosis not present

## 2017-01-15 DIAGNOSIS — I63412 Cerebral infarction due to embolism of left middle cerebral artery: Secondary | ICD-10-CM | POA: Diagnosis not present

## 2017-01-16 DIAGNOSIS — I69354 Hemiplegia and hemiparesis following cerebral infarction affecting left non-dominant side: Secondary | ICD-10-CM | POA: Diagnosis not present

## 2017-01-16 DIAGNOSIS — Z930 Tracheostomy status: Secondary | ICD-10-CM | POA: Diagnosis not present

## 2017-01-16 DIAGNOSIS — I6350 Cerebral infarction due to unspecified occlusion or stenosis of unspecified cerebral artery: Secondary | ICD-10-CM | POA: Diagnosis not present

## 2017-01-16 DIAGNOSIS — J9602 Acute respiratory failure with hypercapnia: Secondary | ICD-10-CM | POA: Diagnosis not present

## 2017-01-16 DIAGNOSIS — I63412 Cerebral infarction due to embolism of left middle cerebral artery: Secondary | ICD-10-CM | POA: Diagnosis not present

## 2017-01-16 DIAGNOSIS — J9601 Acute respiratory failure with hypoxia: Secondary | ICD-10-CM | POA: Diagnosis not present

## 2017-01-24 ENCOUNTER — Other Ambulatory Visit: Payer: Self-pay | Admitting: Unknown Physician Specialty

## 2017-02-25 DEATH — deceased
